# Patient Record
Sex: Female | Born: 1970
Health system: Southern US, Community
[De-identification: ages and names within clinical notes are randomized; demographics above are authoritative.]

## PROBLEM LIST (undated history)

## (undated) DIAGNOSIS — R112 Nausea with vomiting, unspecified: Secondary | ICD-10-CM

## (undated) DIAGNOSIS — T4145XA Adverse effect of unspecified anesthetic, initial encounter: Secondary | ICD-10-CM

## (undated) DIAGNOSIS — M51369 Other intervertebral disc degeneration, lumbar region without mention of lumbar back pain or lower extremity pain: Secondary | ICD-10-CM

## (undated) DIAGNOSIS — T8859XA Other complications of anesthesia, initial encounter: Secondary | ICD-10-CM

## (undated) DIAGNOSIS — Z9889 Other specified postprocedural states: Secondary | ICD-10-CM

## (undated) DIAGNOSIS — M5136 Other intervertebral disc degeneration, lumbar region: Secondary | ICD-10-CM

## (undated) DIAGNOSIS — Z789 Other specified health status: Secondary | ICD-10-CM

## (undated) DIAGNOSIS — E7800 Pure hypercholesterolemia, unspecified: Secondary | ICD-10-CM

## (undated) HISTORY — PX: BREAST CYST ASPIRATION: SHX578

## (undated) HISTORY — PX: WISDOM TOOTH EXTRACTION: SHX21

## (undated) HISTORY — DX: Pure hypercholesterolemia, unspecified: E78.00

---

## 2001-06-19 ENCOUNTER — Other Ambulatory Visit: Admission: RE | Admit: 2001-06-19 | Discharge: 2001-06-19 | Payer: Self-pay | Admitting: Obstetrics and Gynecology

## 2002-11-27 ENCOUNTER — Inpatient Hospital Stay (HOSPITAL_COMMUNITY): Admission: AD | Admit: 2002-11-27 | Discharge: 2002-11-30 | Payer: Self-pay | Admitting: Obstetrics and Gynecology

## 2002-12-26 ENCOUNTER — Other Ambulatory Visit: Admission: RE | Admit: 2002-12-26 | Discharge: 2002-12-26 | Payer: Self-pay | Admitting: Obstetrics and Gynecology

## 2003-09-05 HISTORY — PX: BREAST SURGERY: SHX581

## 2003-11-13 ENCOUNTER — Encounter: Admission: RE | Admit: 2003-11-13 | Discharge: 2003-11-13 | Payer: Self-pay | Admitting: Obstetrics & Gynecology

## 2003-12-23 ENCOUNTER — Other Ambulatory Visit: Admission: RE | Admit: 2003-12-23 | Discharge: 2003-12-23 | Payer: Self-pay | Admitting: Obstetrics and Gynecology

## 2003-12-24 ENCOUNTER — Other Ambulatory Visit: Admission: RE | Admit: 2003-12-24 | Discharge: 2003-12-24 | Payer: Self-pay | Admitting: Obstetrics and Gynecology

## 2004-07-16 ENCOUNTER — Inpatient Hospital Stay (HOSPITAL_COMMUNITY): Admission: AD | Admit: 2004-07-16 | Discharge: 2004-07-17 | Payer: Self-pay | Admitting: Obstetrics and Gynecology

## 2004-12-28 ENCOUNTER — Other Ambulatory Visit: Admission: RE | Admit: 2004-12-28 | Discharge: 2004-12-28 | Payer: Self-pay | Admitting: Obstetrics and Gynecology

## 2005-09-01 ENCOUNTER — Encounter: Admission: RE | Admit: 2005-09-01 | Discharge: 2005-09-01 | Payer: Self-pay | Admitting: General Surgery

## 2007-01-02 ENCOUNTER — Encounter: Admission: RE | Admit: 2007-01-02 | Discharge: 2007-01-02 | Payer: Self-pay | Admitting: Neurological Surgery

## 2007-01-14 ENCOUNTER — Encounter: Admission: RE | Admit: 2007-01-14 | Discharge: 2007-01-14 | Payer: Self-pay | Admitting: Obstetrics and Gynecology

## 2007-01-15 ENCOUNTER — Encounter: Admission: RE | Admit: 2007-01-15 | Discharge: 2007-01-15 | Payer: Self-pay | Admitting: Neurological Surgery

## 2007-01-22 ENCOUNTER — Encounter: Admission: RE | Admit: 2007-01-22 | Discharge: 2007-01-22 | Payer: Self-pay | Admitting: Obstetrics and Gynecology

## 2007-01-22 ENCOUNTER — Encounter (INDEPENDENT_AMBULATORY_CARE_PROVIDER_SITE_OTHER): Payer: Self-pay | Admitting: Diagnostic Radiology

## 2007-04-24 HISTORY — PX: BREAST BIOPSY: SHX20

## 2008-05-13 ENCOUNTER — Encounter: Admission: RE | Admit: 2008-05-13 | Discharge: 2008-05-13 | Payer: Self-pay | Admitting: Neurological Surgery

## 2008-06-03 ENCOUNTER — Ambulatory Visit (HOSPITAL_COMMUNITY): Admission: RE | Admit: 2008-06-03 | Discharge: 2008-06-04 | Payer: Self-pay | Admitting: Neurological Surgery

## 2008-06-03 HISTORY — PX: HEMILAMINOTOMY LUMBAR SPINE: SUR654

## 2008-06-23 ENCOUNTER — Encounter: Admission: RE | Admit: 2008-06-23 | Discharge: 2008-06-23 | Payer: Self-pay | Admitting: Anesthesiology

## 2008-06-24 ENCOUNTER — Encounter: Admission: RE | Admit: 2008-06-24 | Discharge: 2008-06-24 | Payer: Self-pay | Admitting: Neurological Surgery

## 2010-09-25 ENCOUNTER — Encounter: Payer: Self-pay | Admitting: Obstetrics and Gynecology

## 2011-01-17 NOTE — Op Note (Signed)
Stacey Jordan, Stacey Jordan               ACCOUNT NO.:  [id number]   MEDICAL RECORD NO.:  [id number]          PATIENT TYPE:  OIB   LOCATION:  3599                         FACILITY:  [facility]   PHYSICIAN:  Tia Alert, MD     DATE OF BIRTH:  [birth year]   DATE OF PROCEDURE:  06/03/2008  DATE OF DISCHARGE:                               OPERATIVE REPORT   PREOPERATIVE DIAGNOSIS:  Lumbar disk herniation, L5-S1 on the left with  left S1 radiculopathy.   POSTOPERATIVE DIAGNOSIS:  Lumbar disk herniation, L5-S1 on the left with  left S1 radiculopathy.   PROCEDURE:  Left L5-S1 hemilaminectomy, medial facetectomy, and  foraminotomy followed by microdiskectomy of L5-S1 on the left utilizing  microscopic dissection.   SURGEON:  Tia Alert, MD   ASSISTANT:  Donalee Citrin, MD   ANESTHESIA:  General endotracheal.   COMPLICATIONS:  None apparent.   INDICATIONS FOR PROCEDURE:  Ms. Stacey Jordan is a very pleasant 40 year old  female who presented with severe left leg pain, which seemed to follow  an S1 distribution.  She had an MRI, which showed a very large disk  herniation L5-S1 on the left with a large free fragment extending  caudally under the left S1 nerve root.  I recommended microdiskectomy at  L5-S1, left sided, and hope so of improving her pain syndrome.  She  understood the risks, the benefits, and the expected outcome and wished  to proceed.   DESCRIPTION OF PROCEDURE:  The patient was taken to the operating room  and after induction of adequate generalized endotracheal anesthesia, she  was rolled into the prone position on the Wilson frame.  All pressure  points were padded.  Her lumbar region was prepped with DuraPrep and  draped in the usual sterile fashion.  A 5 mL local anesthesia was  injected and a small dorsal midline incision was made and carried down  to the lumbosacral fascia.  The fascia was opened.  The paraspinous  musculature was taken down in a subperiosteal fashion to expose  L5-S1 on  the left side.  Intraoperative x-ray confirmed my level and then used  the high-speed drill and Kerrison punches to perform a hemilaminectomy,  medial facetectomy, and foraminotomy at L5-S1 on the left underlying  yellow ligament was opened and removed in a piecemeal fashion exposing  the underlying dura and S1 nerve root.  The S1 nerve root was retracted  medially and the epidural venous vasculature was coagulated and cut  sharply with micro scissors.  The operating microscope was brought to  the field.  A large disk herniation was found and incised and then  removed with a nerve hook and pituitary rongeurs.  We then incised the  disk space and performed a thorough intradiskal diskectomy, also  bringing down large fragments from the midline.  Once the diskectomy was  complete, I palpated with coronary dilator and a nerve hook into the  midline and into the foramen to assure adequate decompression of the  nerve root.  The nerve root was free and pulsatile irrigated with saline  solution containing bacitracin.  Dried  all bleeding points.  Inspected  the nerve root once again, then lined the dura with dura form to help  prevent epidural fibrosis and then closed with 0 Vicryl in the fascia, 2-  0 Vicryl in the subcutaneous tissues, and 3-0  Vicryl in the subcuticular tissues.  The skin was closed with Benzoin  and Steri-Strips.  The drapes were removed.  Sterile dressing was  applied.  The patient was awakened from general anesthesia and  transferred to the recovery room in stable condition.  At the end of the  procedure, all sponge, needle, and instrument counts were correct.      Tia Alert, MD  Electronically Signed     DSJ/MEDQ  D:  06/03/2008  T:  06/04/2008  Job:  [id number]

## 2011-06-05 LAB — BASIC METABOLIC PANEL
BUN: 11
CO2: 25
Calcium: 9.9
Chloride: 106
Creatinine, Ser: 0.8
GFR calc Af Amer: >60
GFR calc non Af Amer: >60
Glucose, Bld: 94
Potassium: 3.7
Sodium: 137

## 2011-06-05 LAB — DIFFERENTIAL
Basophils Absolute: 0.1
Basophils Relative: 1
Eosinophils Absolute: 0.1
Eosinophils Relative: 1
Lymphocytes Relative: 23
Lymphs Abs: 1.9
Monocytes Absolute: 0.5
Monocytes Relative: 7
Neutro Abs: 5.6
Neutrophils Relative %: 69

## 2011-06-05 LAB — PROTIME-INR
INR: 1.1
Prothrombin Time: 14.9

## 2011-06-05 LAB — CBC
HCT: 40.9
Hemoglobin: 14
MCHC: 34.2
MCV: 90
Platelets: 241
RBC: 4.54
RDW: 12.8
WBC: 8.1

## 2011-06-05 LAB — APTT: aPTT: 33

## 2011-11-13 ENCOUNTER — Other Ambulatory Visit: Payer: Self-pay | Admitting: Obstetrics and Gynecology

## 2011-11-16 ENCOUNTER — Ambulatory Visit
Admission: RE | Admit: 2011-11-16 | Discharge: 2011-11-16 | Disposition: A | Discharge status: Home / Self Care | Payer: 59 | Source: Ambulatory Visit | Attending: Obstetrics and Gynecology | Admitting: Obstetrics and Gynecology

## 2012-10-19 ENCOUNTER — Other Ambulatory Visit: Payer: Self-pay

## 2013-03-27 ENCOUNTER — Encounter (HOSPITAL_BASED_OUTPATIENT_CLINIC_OR_DEPARTMENT_OTHER): Payer: Self-pay | Admitting: *Deleted

## 2013-03-28 ENCOUNTER — Encounter (HOSPITAL_BASED_OUTPATIENT_CLINIC_OR_DEPARTMENT_OTHER): Payer: Self-pay | Admitting: *Deleted

## 2013-03-28 NOTE — Progress Notes (Signed)
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

## 2013-04-03 ENCOUNTER — Encounter (HOSPITAL_BASED_OUTPATIENT_CLINIC_OR_DEPARTMENT_OTHER): Payer: Self-pay | Admitting: Anesthesiology

## 2013-04-03 ENCOUNTER — Encounter (HOSPITAL_BASED_OUTPATIENT_CLINIC_OR_DEPARTMENT_OTHER)
Admission: RE | Disposition: A | Discharge status: Home / Self Care | Payer: Self-pay | Source: Ambulatory Visit | Attending: Orthopedic Surgery

## 2013-04-03 ENCOUNTER — Ambulatory Visit (HOSPITAL_BASED_OUTPATIENT_CLINIC_OR_DEPARTMENT_OTHER): Payer: 59 | Admitting: Anesthesiology

## 2013-04-03 ENCOUNTER — Ambulatory Visit (HOSPITAL_BASED_OUTPATIENT_CLINIC_OR_DEPARTMENT_OTHER)
Admission: RE | Admit: 2013-04-03 | Discharge: 2013-04-03 | Disposition: A | Discharge status: Home / Self Care | Payer: 59 | Source: Ambulatory Visit | Attending: Orthopedic Surgery | Admitting: Orthopedic Surgery

## 2013-04-03 DIAGNOSIS — M51379 Other intervertebral disc degeneration, lumbosacral region without mention of lumbar back pain or lower extremity pain: Secondary | ICD-10-CM | POA: Insufficient documentation

## 2013-04-03 DIAGNOSIS — M629 Disorder of muscle, unspecified: Secondary | ICD-10-CM | POA: Insufficient documentation

## 2013-04-03 DIAGNOSIS — M5137 Other intervertebral disc degeneration, lumbosacral region: Secondary | ICD-10-CM | POA: Insufficient documentation

## 2013-04-03 DIAGNOSIS — R2231 Localized swelling, mass and lump, right upper limb: Secondary | ICD-10-CM

## 2013-04-03 DIAGNOSIS — M2420 Disorder of ligament, unspecified site: Secondary | ICD-10-CM | POA: Insufficient documentation

## 2013-04-03 HISTORY — DX: Other specified postprocedural states: Z98.890

## 2013-04-03 HISTORY — DX: Other intervertebral disc degeneration, lumbar region without mention of lumbar back pain or lower extremity pain: M51.369

## 2013-04-03 HISTORY — PX: MASS EXCISION: SHX2000

## 2013-04-03 HISTORY — DX: Adverse effect of unspecified anesthetic, initial encounter: T41.45XA

## 2013-04-03 HISTORY — DX: Other specified health status: Z78.9

## 2013-04-03 HISTORY — DX: Other complications of anesthesia, initial encounter: T88.59XA

## 2013-04-03 HISTORY — DX: Other intervertebral disc degeneration, lumbar region: M51.36

## 2013-04-03 HISTORY — DX: Other specified postprocedural states: R11.2

## 2013-04-03 LAB — POCT HEMOGLOBIN-HEMACUE: Hemoglobin: 13.6 g/dL (ref 12.0–15.0)

## 2013-04-03 SURGERY — EXCISION MASS
Anesthesia: Monitor Anesthesia Care | Site: Finger | Laterality: Right | Wound class: Clean

## 2013-04-03 MED ORDER — BUPIVACAINE HCL (PF) 0.25 % IJ SOLN
INTRAMUSCULAR | Status: DC | PRN
  Administered 2013-04-03: 30 mL

## 2013-04-03 MED ORDER — HYDROCODONE-ACETAMINOPHEN 5-300 MG PO TABS: 1.0000 | ORAL_TABLET | Freq: Four times a day (QID) | ORAL | Status: DC | PRN

## 2013-04-03 MED ORDER — LIDOCAINE HCL 1 % IJ SOLN
INTRAMUSCULAR | Status: DC | PRN
  Administered 2013-04-03: 30 mL

## 2013-04-03 MED ORDER — ONDANSETRON HCL 4 MG/2ML IJ SOLN
INTRAMUSCULAR | Status: DC | PRN
  Administered 2013-04-03: 4 mg via INTRAVENOUS

## 2013-04-03 MED ORDER — LACTATED RINGERS IV SOLN
INTRAVENOUS | Status: DC
  Administered 2013-04-03: 12:00:00 via INTRAVENOUS

## 2013-04-03 MED ORDER — MIDAZOLAM HCL 5 MG/5ML IJ SOLN
INTRAMUSCULAR | Status: DC | PRN
  Administered 2013-04-03: 2 mg via INTRAVENOUS

## 2013-04-03 MED ORDER — PROPOFOL INFUSION 10 MG/ML OPTIME
INTRAVENOUS | Status: DC | PRN
  Administered 2013-04-03: 100 ug/kg/min via INTRAVENOUS

## 2013-04-03 MED ORDER — FENTANYL CITRATE 0.05 MG/ML IJ SOLN
50.0000 ug | INTRAMUSCULAR | Status: DC | PRN
Start: 2013-04-03 — End: 2013-04-03

## 2013-04-03 MED ORDER — MIDAZOLAM HCL 2 MG/2ML IJ SOLN: 1.0000 mg | INTRAMUSCULAR | Status: DC | PRN

## 2013-04-03 MED ORDER — CEFAZOLIN SODIUM-DEXTROSE 2-3 GM-% IV SOLR
INTRAVENOUS | Status: DC | PRN
  Administered 2013-04-03: 2 g via INTRAVENOUS

## 2013-04-03 MED ORDER — PROPOFOL 10 MG/ML IV BOLUS
INTRAVENOUS | Status: DC | PRN
  Administered 2013-04-03: 80 mg via INTRAVENOUS

## 2013-04-03 MED ORDER — DEXAMETHASONE SODIUM PHOSPHATE 10 MG/ML IJ SOLN
INTRAMUSCULAR | Status: DC | PRN
  Administered 2013-04-03: 10 mg via INTRAVENOUS

## 2013-04-03 MED ORDER — FENTANYL CITRATE 0.05 MG/ML IJ SOLN
INTRAMUSCULAR | Status: DC | PRN
  Administered 2013-04-03: 100 ug via INTRAVENOUS

## 2013-04-03 SURGICAL SUPPLY — 50 items
BANDAGE CONFORM 2  STR LF (GAUZE/BANDAGES/DRESSINGS) IMPLANT
BANDAGE ELASTIC 3 VELCRO ST LF (GAUZE/BANDAGES/DRESSINGS) IMPLANT
BANDAGE ELASTIC 4 VELCRO ST LF (GAUZE/BANDAGES/DRESSINGS) IMPLANT
BANDAGE GAUZE STRT 1 STR LF (GAUZE/BANDAGES/DRESSINGS) ×2 IMPLANT
BENZOIN TINCTURE PRP APPL 2/3 (GAUZE/BANDAGES/DRESSINGS) ×2 IMPLANT
BLADE SURG 15 STRL LF DISP TIS (BLADE) ×1 IMPLANT
BLADE SURG 15 STRL SS (BLADE) ×1
BNDG ESMARK 4X9 LF (GAUZE/BANDAGES/DRESSINGS) ×2 IMPLANT
CORDS BIPOLAR (ELECTRODE) ×2 IMPLANT
COVER MAYO STAND STRL (DRAPES) ×2 IMPLANT
COVER TABLE BACK 60X90 (DRAPES) ×2 IMPLANT
CUFF TOURNIQUET SINGLE 18IN (TOURNIQUET CUFF) IMPLANT
DECANTER SPIKE VIAL GLASS SM (MISCELLANEOUS) IMPLANT
DRAPE EXTREMITY T 121X128X90 (DRAPE) ×2 IMPLANT
DRAPE SURG 17X23 STRL (DRAPES) ×2 IMPLANT
DRSG EMULSION OIL 3X3 NADH (GAUZE/BANDAGES/DRESSINGS) ×2 IMPLANT
GLOVE BIO SURGEON STRL SZ 6.5 (GLOVE) ×2 IMPLANT
GLOVE BIOGEL PI IND STRL 7.0 (GLOVE) ×2 IMPLANT
GLOVE BIOGEL PI IND STRL 8.5 (GLOVE) ×1 IMPLANT
GLOVE BIOGEL PI INDICATOR 7.0 (GLOVE) ×2
GLOVE BIOGEL PI INDICATOR 8.5 (GLOVE) ×1
GLOVE EXAM NITRILE MD LF STRL (GLOVE) ×2 IMPLANT
GLOVE SURG ORTHO 8.0 STRL STRW (GLOVE) ×2 IMPLANT
GOWN PREVENTION PLUS XLARGE (GOWN DISPOSABLE) ×2 IMPLANT
GOWN PREVENTION PLUS XXLARGE (GOWN DISPOSABLE) IMPLANT
GOWN STRL REIN 2XL XLG LVL4 (GOWN DISPOSABLE) ×2 IMPLANT
NEEDLE HYPO 25X1 1.5 SAFETY (NEEDLE) ×2 IMPLANT
NS IRRIG 1000ML POUR BTL (IV SOLUTION) ×2 IMPLANT
PACK BASIN DAY SURGERY FS (CUSTOM PROCEDURE TRAY) ×2 IMPLANT
PAD CAST 3X4 CTTN HI CHSV (CAST SUPPLIES) ×1 IMPLANT
PADDING CAST ABS 3INX4YD NS (CAST SUPPLIES)
PADDING CAST ABS 4INX4YD NS (CAST SUPPLIES)
PADDING CAST ABS COTTON 3X4 (CAST SUPPLIES) IMPLANT
PADDING CAST ABS COTTON 4X4 ST (CAST SUPPLIES) IMPLANT
PADDING CAST COTTON 3X4 STRL (CAST SUPPLIES) ×1
SPLINT PLASTER CAST XFAST 3X15 (CAST SUPPLIES) IMPLANT
SPLINT PLASTER XTRA FASTSET 3X (CAST SUPPLIES)
SPONGE GAUZE 2X2 12PLY UNSTER (GAUZE/BANDAGES/DRESSINGS) ×2 IMPLANT
SPONGE GAUZE 4X4 12PLY (GAUZE/BANDAGES/DRESSINGS) ×2 IMPLANT
STOCKINETTE 4X48 STRL (DRAPES) ×2 IMPLANT
STRIP CLOSURE SKIN 1/2X4 (GAUZE/BANDAGES/DRESSINGS) IMPLANT
SUT MNCRL AB 3-0 PS2 18 (SUTURE) ×2 IMPLANT
SUT MNCRL AB 4-0 PS2 18 (SUTURE) IMPLANT
SUT PROLENE 4 0 PS 2 18 (SUTURE) IMPLANT
SUT PROLENE 5 0 P 3 (SUTURE) ×2 IMPLANT
SYR BULB 3OZ (MISCELLANEOUS) ×2 IMPLANT
SYR CONTROL 10ML LL (SYRINGE) ×2 IMPLANT
TOWEL OR 17X24 6PK STRL BLUE (TOWEL DISPOSABLE) ×2 IMPLANT
TRAY DSU PREP LF (CUSTOM PROCEDURE TRAY) ×2 IMPLANT
UNDERPAD 30X30 INCONTINENT (UNDERPADS AND DIAPERS) ×2 IMPLANT

## 2013-04-03 NOTE — H&P (Signed)
Stacey Jordan is an 42 y.o. female.   Chief Complaint: Right long finger mass  HPI: Pt with mass on right long finger. Pt's mass has been increasing in size. Pt here for surgery excisional biopsy.   Past Medical History  Diagnosis Date  . Complication of anesthesia     very sensitive to aneth and pain meds  . PONV (postoperative nausea and vomiting)   . Medical history non-contributory   . DDD (degenerative disc disease), lumbar     Past Surgical History  Procedure Laterality Date  . Hemilaminotomy lumbar spine Left 06/03/2008    L5-S1; with microdiscectomy and medial facetectomy  . Wisdom tooth extraction      History reviewed. No pertinent family history. Social History:  reports that she has never smoked. She does not have any smokeless tobacco history on file. She reports that  drinks alcohol. She reports that she does not use illicit drugs.  Allergies: No Known Allergies  No prescriptions prior to admission    No results found for this or any previous visit (from the past 48 hour(s)). No results found.  NO recent illnesses or hospitalizations  Blood pressure 113/76, pulse 85, temperature 98.9 F (37.2 C), temperature source Oral, resp. rate 20, height 5\' 4"  (1.626 m), weight 52.527 kg (115 lb 12.8 oz), SpO2 100.00%. General Appearance:  Alert, cooperative, no distress, appears stated age  Head:  Normocephalic, without obvious abnormality, atraumatic  Eyes:  Pupils equal, conjunctiva/corneas clear,         Throat: Lips, mucosa, and tongue normal; teeth and gums normal  Neck: No visible masses     Lungs:   respirations unlabored  Chest Wall:  No tenderness or deformity  Heart:  Regular rate and rhythm,  Abdomen:   Soft, non-tender,         Extremities: Right long finger mass over dorsum of middle phalanx region. No open wounds. Finger warm well perfused Good wrist and digital motion  Pulses: 2+ and symmetric  Skin: Skin color, texture, turgor normal, no  rashes or lesions     Neurologic: Normal    Assessment/Plan Right long finger mass  Right long finger mass excisional biopsy  R/B/A DISCUSSED WITH PT IN OFFICE.  PT VOICED UNDERSTANDING OF PLAN CONSENT SIGNED DAY OF SURGERY PT SEEN AND EXAMINED PRIOR TO OPERATIVE PROCEDURE/DAY OF SURGERY SITE MARKED. QUESTIONS ANSWERED WILL [facility] FOLLOWING SURGERY  Sharma Covert 04/03/2013, 12:25 PM

## 2013-04-03 NOTE — Discharge Instructions (Signed)
KEEP BANDAGE CLEAN AND DRY CALL OFFICE FOR F/U APPT [phone] in 10-12 days KEEP HAND ELEVATED ABOVE HEART OK TO APPLY ICE TO OPERATIVE AREA CONTACT OFFICE IF ANY WORSENING PAIN OR CONCERNS.   Post Anesthesia Home Care Instructions  Activity: Get plenty of rest for the remainder of the day. A responsible adult should stay with you for 24 hours following the procedure.  For the next 24 hours, DO NOT: -Drive a car -Advertising copywriter -Drink alcoholic beverages -Take any medication unless instructed by your physician -Make any legal decisions or sign important papers.  Meals: Start with liquid foods such as gelatin or soup. Progress to regular foods as tolerated. Avoid greasy, spicy, heavy foods. If nausea and/or vomiting occur, drink only clear liquids until the nausea and/or vomiting subsides. Call your physician if vomiting continues.  Special Instructions/Symptoms: Your throat may feel dry or sore from the anesthesia or the breathing tube placed in your throat during surgery. If this causes discomfort, gargle with warm salt water. The discomfort should disappear within 24 hours.

## 2013-04-03 NOTE — Anesthesia Preprocedure Evaluation (Addendum)
Anesthesia Evaluation  Patient identified by MRN, date of birth, ID band  Reviewed: Allergy & Precautions, H&P , NPO status , Patient's Chart, lab work & pertinent test results  History of Anesthesia Complications (+) PONV  Airway Mallampati: II TM Distance: >3 FB Neck ROM: Full    Dental no notable dental hx. (+) Teeth Intact and Dental Advisory Given   Pulmonary neg pulmonary ROS,  breath sounds clear to auscultation  Pulmonary exam normal       Cardiovascular negative cardio ROS  Rhythm:Regular Rate:Normal     Neuro/Psych negative neurological ROS  negative psych ROS   GI/Hepatic negative GI ROS, Neg liver ROS,   Endo/Other  negative endocrine ROS  Renal/GU negative Renal ROS  negative genitourinary   Musculoskeletal   Abdominal   Peds  Hematology negative hematology ROS (+)   Anesthesia Other Findings   Reproductive/Obstetrics negative OB ROS                          Anesthesia Physical Anesthesia Plan  ASA: I  Anesthesia Plan: MAC   Post-op Pain Management:    Induction: Intravenous  Airway Management Planned: Simple Face Mask  Additional Equipment:   Intra-op Plan:   Post-operative Plan:   Informed Consent: I have reviewed the patients History and Physical, chart, labs and discussed the procedure including the risks, benefits and alternatives for the proposed anesthesia with the patient or authorized representative who has indicated his/her understanding and acceptance.   Dental advisory given  Plan Discussed with: CRNA  Anesthesia Plan Comments:         Anesthesia Quick Evaluation

## 2013-04-03 NOTE — Transfer of Care (Signed)
Immediate Anesthesia Transfer of Care Note  Patient: Stacey Jordan  Procedure(s) Performed: Procedure(s):  MASS EXCISION AND BIOPSY (Right)  Patient Location: PACU  Anesthesia Type:MAC  Level of Consciousness: awake, alert  and oriented  Airway & Oxygen Therapy: Patient Spontanous Breathing  Post-op Assessment: Report given to PACU RN and Post -op Vital signs reviewed and stable  Post vital signs: Reviewed and stable  Complications: No apparent anesthesia complications

## 2013-04-03 NOTE — Brief Op Note (Signed)
04/03/2013  1:42 PM  PATIENT:  Stacey Jordan  42 y.o. female  PRE-OPERATIVE DIAGNOSIS:  RIGHT LONG FINGER MASS  POST-OPERATIVE DIAGNOSIS:  same  PROCEDURE:  Procedure(s):  MASS EXCISION AND BIOPSY (Right)  SURGEON:  Surgeon(s) and Role:    * Sharma Covert, MD - Primary  PHYSICIAN ASSISTANT: none   ASSISTANTS: none   ANESTHESIA:   general and MAC  EBL:  Total I/O In: 200 [I.V.:200] Out: -   BLOOD ADMINISTERED:none  DRAINS: none   LOCAL MEDICATIONS USED:  MARCAINE     SPECIMEN:  Biopsy / Limited Resection  DISPOSITION OF SPECIMEN:  PATHOLOGY  COUNTS:  YES  TOURNIQUET:   Total Tourniquet Time Documented: Upper Arm (Right) - 13 minutes Total: Upper Arm (Right) - 13 minutes   DICTATION: .Other Dictation: Dictation Number [id number]  PLAN OF CARE: Discharge to home after PACU  PATIENT DISPOSITION:  PACU - hemodynamically stable.   Delay start of Pharmacological VTE agent (>24hrs) due to surgical blood loss or risk of bleeding: not applicable

## 2013-04-04 NOTE — Anesthesia Postprocedure Evaluation (Signed)
  Anesthesia Post-op Note  Patient: Stacey Jordan  Procedure(s) Performed: Procedure(s):  MASS EXCISION AND BIOPSY (Right)  Patient Location: PACU  Anesthesia Type:MAC  Level of Consciousness: awake  Airway and Oxygen Therapy: Patient Spontanous Breathing  Post-op Pain: none  Post-op Assessment: Post-op Vital signs reviewed, Patient's Cardiovascular Status Stable and Respiratory Function Stable  Post-op Vital Signs: Reviewed and stable  Complications: No apparent anesthesia complications

## 2013-04-06 LAB — TISSUE CULTURE: Gram Stain: NONE SEEN

## 2013-04-07 NOTE — Op Note (Signed)
NAME:  Stacey Jordan, Stacey Jordan NO.:  [id number]  MEDICAL RECORD NO.:  [id number]  LOCATION:                               FACILITY:  [facility]  PHYSICIAN:  Madelynn Done, MD  DATE OF BIRTH:  Aug 07, 1971  DATE OF PROCEDURE:  04/03/2013 DATE OF DISCHARGE:  04/03/2013                              OPERATIVE REPORT   PREOPERATIVE DIAGNOSIS:  Right long finger volar mass less than 1.5 cm, deep mass.  POSTOPERATIVE DIAGNOSIS:  Right long finger volar mass less than 1.5 cm, deep mass.  ATTENDING PHYSICIAN:  Madelynn Done, MD, who scrubbed and present for the entire procedure.  ASSISTANT SURGEON:  None.  ANESTHESIA:  1% Xylocaine, 0.25% Marcaine, local block with IV sedation.  SURGICAL PROCEDURE:  Right long finger deep mass excision less than 1.5 cm.  SURGICAL INDICATIONS:  Mrs. Stacey Jordan is a right-hand-dominant female with enlarging mass of the right long finger.  The patient has elected to undergo excisional biopsy.  Risks, benefits and alternatives were discussed in detail with the patient.  Signed informed consent was obtained.  Risks include, but not limited to bleeding; infection; damage to nearby nerves, arteries, or tendons; loss of motion of wrist and digits; incomplete relief of symptoms; and need for further surgical intervention.  DESCRIPTION OF PROCEDURE:  The patient was properly identified in the preop holding area and marked with a permanent marker made on the right long finger to indicate the correct operative site.  The patient was then brought back to the operating room and placed supine on the anesthesia room table where the IV sedation was administered.  The patient tolerated this well.  A well-padded tourniquet was then placed in the right forearm and sealed with 1000 drape.  Local anesthetic was administered.  The patient tolerated this.  The right upper extremity was then prepped and draped in normal sterile fashion.  Time-out was called, the  correct side was identified and procedure again.  An oblique incision was made directly over the middle phalanx of the long finger after the tourniquet insufflated.  Dissection was then carried down through the skin and subcutaneous tissue deep to the subcutaneous tissue.  The mass was then encountered.  This was less than 1.5 cm mass, the adherence of the flexor sheath.  Neurovascular bundles were then retracted in their respective directions.  Circumferential dissection was then carried out.  I did have some mild adhesions to the subcutaneous tissue and skin.  It had appearance more of a granuloma. It did not appear to look like a giant cell tumor, tendon sheath or lipoma, did not have very distinct borders.  After removal of the visible/specimen, the wound was then thoroughly irrigated.  The skin was then closed using simple Prolene sutures.  Tourniquet was deflated. There was good perfusion of the fingertip.  Adaptic dressing and sterile compressive bandage were then applied.  The patient tolerated the procedure well and returned to the recovery room in good condition.  POSTPROCEDURAL PLAN:  The patient will be discharged home, seen back in the office in approximately 10 days for wound check, suture removal, and go over the Pathology.  Pathology, tissue specimens  for culture, aerobic, anaerobic, mycobacterium atypical and fungi as well as tissue for pathology looking for granulomatous disease.     Madelynn Done, MD     FWO/MEDQ  D:  04/03/2013  T:  04/03/2013  Job:  [id number]

## 2013-04-08 LAB — ANAEROBIC CULTURE: Gram Stain: NONE SEEN

## 2013-05-02 LAB — FUNGUS CULTURE W SMEAR

## 2013-05-16 LAB — AFB CULTURE WITH SMEAR (NOT AT ARMC)

## 2013-05-20 ENCOUNTER — Encounter: Payer: Self-pay | Admitting: Sports Medicine

## 2013-05-20 ENCOUNTER — Ambulatory Visit (INDEPENDENT_AMBULATORY_CARE_PROVIDER_SITE_OTHER): Payer: 59 | Admitting: Sports Medicine

## 2013-05-20 VITALS — BP 121/79 | HR 66 | Ht 64.0 in | Wt 118.0 lb

## 2013-05-20 DIAGNOSIS — G8929 Other chronic pain: Secondary | ICD-10-CM | POA: Insufficient documentation

## 2013-05-20 DIAGNOSIS — M7710 Lateral epicondylitis, unspecified elbow: Secondary | ICD-10-CM

## 2013-05-20 DIAGNOSIS — M7711 Lateral epicondylitis, right elbow: Secondary | ICD-10-CM

## 2013-05-20 DIAGNOSIS — M25529 Pain in unspecified elbow: Secondary | ICD-10-CM

## 2013-05-20 MED ORDER — NITROGLYCERIN 0.2 MG/HR TD PT24: MEDICATED_PATCH | TRANSDERMAL | Status: DC

## 2013-05-20 NOTE — Assessment & Plan Note (Signed)
She uses a low dose of Advil and I advised her off of that would be fine to continue for pain

## 2013-05-20 NOTE — Assessment & Plan Note (Signed)
Trial with nitroglycerin protocol  Exercise protocol to do daily  Use compression sleeve  She can try a cockup brace for typing if needed  Rescan 6 weeks

## 2013-05-20 NOTE — Patient Instructions (Addendum)
Nitroglycerin Protocol   Apply 1/4 nitroglycerin patch to affected area daily.  Change position of patch within the affected area every 24 hours.  You may experience a headache during the first 1-2 weeks of using the patch, these should subside.  If you experience headaches after beginning nitroglycerin patch treatment, you may take your preferred over the counter pain reliever.  Another side effect of the nitroglycerin patch is skin irritation or rash related to patch adhesive.  Please notify our office if you develop more severe headaches or rash, and stop the patch.  Tendon healing with nitroglycerin patch may require 12 to 24 weeks depending on the extent of injury.  Men should not use if taking Viagra, Cialis, or Levitra.   Do not use if you have migraines or rosacea.   Please do suggested exercises daily  Please follow up in 4-6 weeks   Thank you for seeing [place] today!

## 2013-05-20 NOTE — Progress Notes (Signed)
  Subjective:    Patient ID: Stacey Jordan, female    DOB: Apr 04, 1971, 42 y.o.   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 It bothers her the most when she tries to straighten her arm. Her pain is on the lateral side of her elbow. She has a hard time typing. No numbness or tingling.   She states that while her injury started without a specific event she does think it may relate to a day when she spent a long time working in her yard   Review of Systems     Objective:   Physical Exam Physically fit female in no acute distress  Right elbow: No swelling or deformity, full ROM with elbow flexion/extension, but pain with extension. Tenderness to palpation on lateral epicondyle. Pain with resisted wrist extension and supination. Positive book test.   Left elbow: No swelling or deformity, full ROM, minimal tenderness to palpation of lateral epicondyle, no pain with resisted wrist extension or supination. Positive book test.   Ultrasound: Right elbow shows a small area of hypoechoic change at the insertion of the extensor tendons More to the ulnar side No neo-vessel activity There is some fibrosis noted in the extensor tendons indicated by Hyper- echogenicity  Left elbow shows an intact area of the extensor tendons without any tears and normal blood flow Slight hyper-echogenicity      Assessment & Plan:  Right lateral epicondylitis   - Nitroglycerin patches to right elbow - Elbow compression sleeve - PT exercises

## 2013-07-02 ENCOUNTER — Ambulatory Visit (INDEPENDENT_AMBULATORY_CARE_PROVIDER_SITE_OTHER): Payer: 59 | Admitting: Sports Medicine

## 2013-07-02 ENCOUNTER — Encounter: Payer: Self-pay | Admitting: Sports Medicine

## 2013-07-02 VITALS — BP 124/82 | HR 60 | Ht 64.0 in | Wt 118.0 lb

## 2013-07-02 DIAGNOSIS — M25529 Pain in unspecified elbow: Secondary | ICD-10-CM

## 2013-07-02 DIAGNOSIS — G8929 Other chronic pain: Secondary | ICD-10-CM

## 2013-07-02 DIAGNOSIS — M7710 Lateral epicondylitis, unspecified elbow: Secondary | ICD-10-CM

## 2013-07-02 DIAGNOSIS — M7711 Lateral epicondylitis, right elbow: Secondary | ICD-10-CM

## 2013-07-02 NOTE — Assessment & Plan Note (Signed)
This is improving and I think she should continue the exercise program  Continue the nitroglycerin  Avoid lifting with full elbow extension  Recheck in 6-8 weeks

## 2013-07-02 NOTE — Progress Notes (Signed)
Patient ID: Stacey Jordan, female   DOB: 06/30/1971, 42 y.o.   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 She does notice the pain will increase if she does too much typing  She has used the nitroglycerin without much difficulty She has been somewhat inconsistent with her home exercises  She feels that she is seen 25-35% improvement No longer does any pain wake her up at night  She does feel this if she tries to care he something heavy or light grocery bags with her arm extended  PExam Physically fit female in no acute distress  She has hyperextension of both elbows and this is somewhat of an increase in range of motion of the right elbow She has mild direct tenderness over the right lateral epicondyle Note that a significant over the left Strength seems to be good on the right and left Test still does create some pain on the right  Ultrasound examination Extensor tendon is intact Hypoechoic change is much smaller There is some slight calcification in the area of injury Transverse scan confirms this Doppler flow has not increased in the area of injury in the tendon with neo-vessels  Left tendon appears normal to me

## 2013-07-02 NOTE — Assessment & Plan Note (Signed)
This is making reasonable improvement with no nocturnal pain  She can periodically try nitroglycerin on the left elbow if she has much pain to see if it makes a difference on that one as well

## 2013-07-10 ENCOUNTER — Other Ambulatory Visit: Payer: Self-pay

## 2013-07-21 ENCOUNTER — Encounter: Payer: Self-pay | Admitting: Sports Medicine

## 2013-07-21 ENCOUNTER — Ambulatory Visit (INDEPENDENT_AMBULATORY_CARE_PROVIDER_SITE_OTHER): Payer: 59 | Admitting: Sports Medicine

## 2013-07-21 VITALS — Ht 64.0 in | Wt 118.0 lb

## 2013-07-21 DIAGNOSIS — M25562 Pain in left knee: Secondary | ICD-10-CM

## 2013-07-21 DIAGNOSIS — M25569 Pain in unspecified knee: Secondary | ICD-10-CM

## 2013-07-22 NOTE — Progress Notes (Signed)
  Subjective:    Patient ID: Stacey Jordan, female    DOB: Mar 26, 1971, 43 y.o.   MRN: [id number]  HPI chief complaint: Left knee pain  Very pleasant 41 year old female comes in today complaining of several days of left knee pain. Pain began acutely after doing quite a bit of walking in [place]. She had pain as well as noticeable swelling over the anterior knee which seemed to Center itself over the tibial tuberosity. Over the past several days her pain and swelling have improved tremendously but she initially was having significant pain especially with active extension of the knee. She has been icing and feels like this has been helpful. She's had intermittent anterior knee pain in the past but her current pain and swelling are different in nature than what she experienced previously. No prior knee surgeries. No mechanical symptoms. No pain at rest. No fevers or chills. With relative rest and icing she feels like her symptoms have improved by about "80%" over the past week or so.  Past medical history and current medications are reviewed    Review of Systems     Objective:   Physical Exam Well-developed, fit-appearing. No acute distress. Awake alert and oriented x3. Vital signs are reviewed  Left knee: Full range of motion. 2-3+ patellofemoral crepitus. No effusion. No noticeable soft tissue swelling. Bilateral VMO weakness. There is tenderness to palpation directly over the tibial tubercle. No real tenderness along the patellar tendon. No tenderness or swelling directly over the patella. Knee is stable to valgus and varus stressing. Negative anterior drawer. Negative posterior drawer. No joint line tenderness. Negative McMurray's. Negative Thessaly's. Neurovascularly intact distally. Walking without a limp.  MSK ultrasound of the left knee was performed. Limited images of the anterior knee were obtained. Patellar tendon appears to be within normal limits. Insertion onto the tibial  tuberosity also appears to be within normal limits. There is a small hypoechoic area just anterior to the tibial tuberosity consistent with some minimal soft tissue swelling here but otherwise the scan is unremarkable.       Assessment & Plan:  Improving left knee pain likely secondary to insertional patellar tendinitis  I do not see any patellar tendon tear and on today's ultrasound. Her symptoms are already improving. I recommended she continue with ice and over-the-counter ibuprofen as needed. She can increase activity as tolerated. I've given her an eccentric home exercise for her patellar tendon and I've also instructed her in VMO exercises. As long as she is able to return to regular activity without complication she can followup when necessary.

## 2013-08-13 ENCOUNTER — Ambulatory Visit: Payer: 59 | Admitting: Sports Medicine

## 2014-02-09 ENCOUNTER — Ambulatory Visit (INDEPENDENT_AMBULATORY_CARE_PROVIDER_SITE_OTHER): Payer: 59 | Admitting: Podiatry

## 2014-02-09 ENCOUNTER — Encounter: Payer: Self-pay | Admitting: Podiatry

## 2014-02-09 DIAGNOSIS — B079 Viral wart, unspecified: Secondary | ICD-10-CM

## 2014-02-09 NOTE — Progress Notes (Signed)
Subjective:     Patient ID: Stacey Jordan, female   DOB: 1970/10/22, 43 y.o.   MRN: [id number]  Foot Pain   patient states that I have something on the bottom my left heel that I went to urgent care and that cut on it but they were not sure what it was  Review of Systems  All other systems reviewed and are negative.      Objective:   Physical Exam  Nursing note and vitals reviewed. Constitutional: She is oriented to person, place, and time.  Cardiovascular: Intact distal pulses.   Musculoskeletal: Normal range of motion.  Neurological: She is oriented to person, place, and time.  Skin: Skin is warm.   neurovascular status intact with health history unchanged and range of motion subtalar and midtarsal joint within normal limits. Muscle strength found to be adequate and no equinus condition noted and digits are found to be well perfused. Plantar aspect of left heel there is a small nodule that I also looked at the picture of which appears to be a lucent center with pain upon lateral pressure     Assessment:     Possible porokeratosis versus possible verruca plantaris    Plan:     H&P performed condition discussed. Today I went ahead debris the lesion fully and applied chemical to it under occlusion and advised if it should recur for her to reappoint immediately. Should resolve

## 2014-02-09 NOTE — Progress Notes (Signed)
   Subjective:    Patient ID: Stacey Jordan, female    DOB: 08-20-1971, 43 y.o.   MRN: [id number]  HPI Comments: "I have something on my foot"  Patient c/o tender, callused spot plantar left heel for few weeks. She went to urgent care and they trimmed on it. She has been using a drawing salve with no relief.  Foot Pain      Review of Systems  All other systems reviewed and are negative.      Objective:   Physical Exam        Assessment & Plan:

## 2015-11-04 ENCOUNTER — Telehealth: Payer: 59 | Admitting: Physician Assistant

## 2015-11-04 DIAGNOSIS — R6889 Other general symptoms and signs: Secondary | ICD-10-CM | POA: Diagnosis not present

## 2015-11-04 MED ORDER — OSELTAMIVIR PHOSPHATE 75 MG PO CAPS: 75.0000 mg | ORAL_CAPSULE | Freq: Two times a day (BID) | ORAL | Status: DC

## 2015-11-04 MED FILL — OSELTAMIVIR PHOS 75 MG CAP: 75 | 5 days supply | Qty: 10 | Fill #0

## 2015-11-04 NOTE — Progress Notes (Signed)
E visit for Flu like symptoms   We are sorry that you are not feeling well.  Here is how we plan to help! Based on what you have shared with me it looks like you may have a respiratory virus that may be influenza. Typically giving your young age and overall good health, we would not need to treat with Tamiflu for flu-like illness. 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 Please follow the instructions and read information below.  Influenza or "the flu" is   an infection caused by a respiratory virus. The flu virus is highly contagious and persons who did not receive their yearly flu vaccination may "catch" the flu from close contact.  We have anti-viral medications to treat the viruses that cause this infection. They are not a "cure" and only shorten the course of the infection. These prescriptions are most effective when they are given within the first 2 days of "flu" symptoms. Antiviral medication are indicated if you have a high risk of complications from the flu. You should  also consider an antiviral medication if you are in close contact with someone who is at risk. These medications can help patients avoid complications from the flu  but have side effects that you should know. Possible side effects from Tamiflu or oseltamivir include nausea, vomiting, diarrhea, dizziness, headaches, eye redness, sleep problems or other respiratory symptoms. You should not take Tamiflu if you have an allergy to oseltamivir or any to the ingredients in Tamiflu.  Based upon your symptoms and potential risk factors I have prescribed Oseltamivir (Tamiflu).  It has been sent to your designated pharmacy.  You will take one 75 mg capsule orally twice a day for the next 5 days.  ANYONE WHO HAS FLU SYMPTOMS SHOULD: . Stay home. The flu is highly contagious and going out or  to work exposes others! . Be sure to drink plenty of fluids. Water is fine as well as fruit juices, sodas and electrolyte beverages. You may want to stay away from caffeine or alcohol. If you are nauseated, try taking small sips of liquids. How do you know if you are getting enough fluid? Your urine should be a pale yellow or almost colorless. . Get rest. . Taking a steamy shower or using a humidifier may help nasal congestion and ease sore throat pain. Using a saline nasal spray works much the same way. . Cough drops, hard candies and sore throat lozenges may ease your cough. . Line up a caregiver. Have someone check on you regularly.   GET HELP RIGHT AWAY IF: . You cannot keep down liquids or your medications. . You become short of breath . Your fell like you are going to pass out or loose consciousness. . Your symptoms persist after you have completed your treatment plan MAKE SURE YOU   Understand these instructions.  Will watch your condition.  Will get help right away if you are not doing well or get worse.  Your e-visit answers were reviewed by a board certified advanced clinical practitioner to complete your personal care plan.  Depending on the condition, your plan could have included both over the counter or prescription medications.  If there is a problem please reply  once you have received a response from your provider.  Your safety is important to [place].  If you have drug allergies check your prescription carefully.    You can use MyChart to ask questions about today's visit, request a non-urgent call back, or ask for a work or school excuse for 24 hours related to this e-Visit. If it has been greater than 24 hours you will need to follow up with your provider, or enter a new e-Visit to address those concerns.  You will get an e-mail in the next two days asking about your experience.  I hope that your e-visit has been valuable and will speed your recovery. Thank you for using  e-visits.

## 2015-12-30 ENCOUNTER — Encounter: Payer: 59 | Admitting: Obstetrics and Gynecology

## 2016-01-10 ENCOUNTER — Encounter: Payer: 59 | Admitting: Obstetrics and Gynecology

## 2016-01-20 ENCOUNTER — Encounter: Payer: Self-pay | Admitting: Obstetrics and Gynecology

## 2016-01-20 ENCOUNTER — Ambulatory Visit (INDEPENDENT_AMBULATORY_CARE_PROVIDER_SITE_OTHER): Payer: 59 | Admitting: Obstetrics and Gynecology

## 2016-01-20 VITALS — BP 100/62 | HR 74 | Resp 16 | Ht 62.75 in | Wt 116.0 lb

## 2016-01-20 DIAGNOSIS — Z1151 Encounter for screening for human papillomavirus (HPV): Secondary | ICD-10-CM | POA: Diagnosis not present

## 2016-01-20 DIAGNOSIS — N63 Unspecified lump in breast: Secondary | ICD-10-CM | POA: Diagnosis not present

## 2016-01-20 DIAGNOSIS — N393 Stress incontinence (female) (male): Secondary | ICD-10-CM

## 2016-01-20 DIAGNOSIS — Z23 Encounter for immunization: Secondary | ICD-10-CM | POA: Diagnosis not present

## 2016-01-20 DIAGNOSIS — N6320 Unspecified lump in the left breast, unspecified quadrant: Secondary | ICD-10-CM

## 2016-01-20 DIAGNOSIS — Z01419 Encounter for gynecological examination (general) (routine) without abnormal findings: Secondary | ICD-10-CM | POA: Diagnosis not present

## 2016-01-20 DIAGNOSIS — Z0000 Encounter for general adult medical examination without abnormal findings: Secondary | ICD-10-CM

## 2016-01-20 LAB — COMPREHENSIVE METABOLIC PANEL
ALT: 8 U/L (ref 6–29)
AST: 15 U/L (ref 10–30)
Albumin: 4.3 g/dL (ref 3.6–5.1)
Alkaline Phosphatase: 38 U/L (ref 33–115)
BUN: 13 mg/dL (ref 7–25)
CALCIUM: 9 mg/dL (ref 8.6–10.2)
CO2: 27 mmol/L (ref 20–31)
Chloride: 104 mmol/L (ref 98–110)
Creat: 0.92 mg/dL (ref 0.50–1.10)
GLUCOSE: 94 mg/dL (ref 65–99)
POTASSIUM: 3.8 mmol/L (ref 3.5–5.3)
Sodium: 137 mmol/L (ref 135–146)
Total Bilirubin: 0.6 mg/dL (ref 0.2–1.2)
Total Protein: 6.9 g/dL (ref 6.1–8.1)

## 2016-01-20 LAB — CBC
HCT: 38.9 % (ref 35.0–45.0)
HEMOGLOBIN: 12.9 g/dL (ref 11.7–15.5)
MCH: 29.8 pg (ref 27.0–33.0)
MCHC: 33.2 g/dL (ref 32.0–36.0)
MCV: 89.8 fL (ref 80.0–100.0)
MPV: 10.2 fL (ref 7.5–12.5)
Platelets: 239 10*3/uL (ref 140–400)
RBC: 4.33 MIL/uL (ref 3.80–5.10)
RDW: 13.4 % (ref 11.0–15.0)
WBC: 7 10*3/uL (ref 3.8–10.8)

## 2016-01-20 LAB — POCT URINALYSIS DIPSTICK
BILIRUBIN UA: NEGATIVE
GLUCOSE UA: NEGATIVE
KETONES UA: NEGATIVE
LEUKOCYTES UA: NEGATIVE
Nitrite, UA: NEGATIVE
PH UA: 5
Protein, UA: NEGATIVE
RBC UA: NEGATIVE
Urobilinogen, UA: NEGATIVE

## 2016-01-20 LAB — BASIC METABOLIC PANEL
BUN: 13 mg/dL (ref 7–25)
CHLORIDE: 104 mmol/L (ref 98–110)
CO2: 27 mmol/L (ref 20–31)
CREATININE: 0.92 mg/dL (ref 0.50–1.10)
Calcium: 9 mg/dL (ref 8.6–10.2)
Glucose, Bld: 94 mg/dL (ref 65–99)
Potassium: 3.8 mmol/L (ref 3.5–5.3)
SODIUM: 137 mmol/L (ref 135–146)

## 2016-01-20 LAB — LIPID PANEL
Cholesterol: 188 mg/dL (ref 125–200)
HDL: 69 mg/dL (ref >=46)
LDL CALC: 94 mg/dL (ref <130)
Total CHOL/HDL Ratio: 2.7 Ratio (ref <=5.0)
Triglycerides: 126 mg/dL (ref <150)
VLDL: 25 mg/dL (ref <30)

## 2016-01-20 NOTE — Patient Instructions (Signed)
Health Maintenance, Female Adopting a healthy lifestyle and getting preventive care can go a long way to promote health and wellness. Talk with your health care provider about what schedule of regular examinations is right for you. This is a good chance for you to check in with your provider about disease prevention and staying healthy. In between checkups, there are plenty of things you can do on your own. Experts have done a lot of research about which lifestyle changes and preventive measures are most likely to keep you healthy. Ask your health care provider for more information. WEIGHT AND DIET  Eat a healthy diet  Be sure to include plenty of vegetables, fruits, low-fat dairy products, and lean protein.  Do not eat a lot of foods high in solid fats, added sugars, or salt.  Get regular exercise. This is one of the most important things you can do for your health.  Most adults should exercise for at least 150 minutes each week. The exercise should increase your heart rate and make you sweat (moderate-intensity exercise).  Most adults should also do strengthening exercises at least twice a week. This is in addition to the moderate-intensity exercise.  Maintain a healthy weight  Body mass index (BMI) is a measurement that can be used to identify possible weight problems. It estimates body fat based on height and weight. Your health care provider can help determine your BMI and help you achieve or maintain a healthy weight.  For females 20 years of age and older:   A BMI below 18.5 is considered underweight.  A BMI of 18.5 to 24.9 is normal.  A BMI of 25 to 29.9 is considered overweight.  A BMI of 30 and above is considered obese.  Watch levels of cholesterol and blood lipids  You should start having your blood tested for lipids and cholesterol at 44 years of age, then have this test every 5 years.  You may need to have your cholesterol levels checked more often if:  Your lipid  or cholesterol levels are high.  You are older than 44 years of age.  You are at high risk for heart disease.  CANCER SCREENING   Lung Cancer  Lung cancer screening is recommended for adults 55-80 years old who are at high risk for lung cancer because of a history of smoking.  A yearly low-dose CT scan of the lungs is recommended for people who:  Currently smoke.  Have quit within the past 15 years.  Have at least a 30-pack-year history of smoking. A pack year is smoking an average of one pack of cigarettes a day for 1 year.  Yearly screening should continue until it has been 15 years since you quit.  Yearly screening should stop if you develop a health problem that would prevent you from having lung cancer treatment.  Breast Cancer  Practice breast self-awareness. This means understanding how your breasts normally appear and feel.  It also means doing regular breast self-exams. Let your health care provider know about any changes, no matter how small.  If you are in your 20s or 30s, you should have a clinical breast exam (CBE) by a health care provider every 1-3 years as part of a regular health exam.  If you are 40 or older, have a CBE every year. Also consider having a breast X-ray (mammogram) every year.  If you have a family history of breast cancer, talk to your health care provider about genetic screening.  If you   are at high risk for breast cancer, talk to your health care provider about having an MRI and a mammogram every year.  Breast cancer gene (BRCA) assessment is recommended for women who have family members with BRCA-related cancers. BRCA-related cancers include:  Breast.  Ovarian.  Tubal.  Peritoneal cancers.  Results of the assessment will determine the need for genetic counseling and BRCA1 and BRCA2 testing. Cervical Cancer Your health care provider may recommend that you be screened regularly for cancer of the pelvic organs (ovaries, uterus, and  vagina). This screening involves a pelvic examination, including checking for microscopic changes to the surface of your cervix (Pap test). You may be encouraged to have this screening done every 3 years, beginning at age 21.  For women ages 30-65, health care providers may recommend pelvic exams and Pap testing every 3 years, or they may recommend the Pap and pelvic exam, combined with testing for human papilloma virus (HPV), every 5 years. Some types of HPV increase your risk of cervical cancer. Testing for HPV may also be done on women of any age with unclear Pap test results.  Other health care providers may not recommend any screening for nonpregnant women who are considered low risk for pelvic cancer and who do not have symptoms. Ask your health care provider if a screening pelvic exam is right for you.  If you have had past treatment for cervical cancer or a condition that could lead to cancer, you need Pap tests and screening for cancer for at least 20 years after your treatment. If Pap tests have been discontinued, your risk factors (such as having a new sexual partner) need to be reassessed to determine if screening should resume. Some women have medical problems that increase the chance of getting cervical cancer. In these cases, your health care provider may recommend more frequent screening and Pap tests. Colorectal Cancer  This type of cancer can be detected and often prevented.  Routine colorectal cancer screening usually begins at 44 years of age and continues through 44 years of age.  Your health care provider may recommend screening at an earlier age if you have risk factors for colon cancer.  Your health care provider may also recommend using home test kits to check for hidden blood in the stool.  A small camera at the end of a tube can be used to examine your colon directly (sigmoidoscopy or colonoscopy). This is done to check for the earliest forms of colorectal  cancer.  Routine screening usually begins at age 50.  Direct examination of the colon should be repeated every 5-10 years through 44 years of age. However, you may need to be screened more often if early forms of precancerous polyps or small growths are found. Skin Cancer  Check your skin from head to toe regularly.  Tell your health care provider about any new moles or changes in moles, especially if there is a change in a mole's shape or color.  Also tell your health care provider if you have a mole that is larger than the size of a pencil eraser.  Always use sunscreen. Apply sunscreen liberally and repeatedly throughout the day.  Protect yourself by wearing long sleeves, pants, a wide-brimmed hat, and sunglasses whenever you are outside. HEART DISEASE, DIABETES, AND HIGH BLOOD PRESSURE   High blood pressure causes heart disease and increases the risk of stroke. High blood pressure is more likely to develop in:  People who have blood pressure in the high end   of the normal range (130-139/85-89 mm Hg).  People who are overweight or obese.  People who are African American.  If you are 38-23 years of age, have your blood pressure checked every 3-5 years. If you are 61 years of age or older, have your blood pressure checked every year. You should have your blood pressure measured twice--once when you are at a hospital or clinic, and once when you are not at a hospital or clinic. Record the average of the two measurements. To check your blood pressure when you are not at a hospital or clinic, you can use:  An automated blood pressure machine at a pharmacy.  A home blood pressure monitor.  If you are between 45 years and 39 years old, ask your health care provider if you should take aspirin to prevent strokes.  Have regular diabetes screenings. This involves taking a blood sample to check your fasting blood sugar level.  If you are at a normal weight and have a low risk for diabetes,  have this test once every three years after 44 years of age.  If you are overweight and have a high risk for diabetes, consider being tested at a younger age or more often. PREVENTING INFECTION  Hepatitis B  If you have a higher risk for hepatitis B, you should be screened for this virus. You are considered at high risk for hepatitis B if:  You were born in a country where hepatitis B is common. Ask your health care provider which countries are considered high risk.  Your parents were born in a high-risk country, and you have not been immunized against hepatitis B (hepatitis B vaccine).  You have HIV or AIDS.  You use needles to inject street drugs.  You live with someone who has hepatitis B.  You have had sex with someone who has hepatitis B.  You get hemodialysis treatment.  You take certain medicines for conditions, including cancer, organ transplantation, and autoimmune conditions. Hepatitis C  Blood testing is recommended for:  Everyone born from [birth year] through 1964.  Anyone with known risk factors for hepatitis C. Sexually transmitted infections (STIs)  You should be screened for sexually transmitted infections (STIs) including gonorrhea and chlamydia if:  You are sexually active and are younger than 44 years of age.  You are older than 44 years of age and your health care provider tells you that you are at risk for this type of infection.  Your sexual activity has changed since you were last screened and you are at an increased risk for chlamydia or gonorrhea. Ask your health care provider if you are at risk.  If you do not have HIV, but are at risk, it may be recommended that you take a prescription medicine daily to prevent HIV infection. This is called pre-exposure prophylaxis (PrEP). You are considered at risk if:  You are sexually active and do not regularly use condoms or know the HIV status of your partner(s).  You take drugs by injection.  You are sexually  active with a partner who has HIV. Talk with your health care provider about whether you are at high risk of being infected with HIV. If you choose to begin PrEP, you should first be tested for HIV. You should then be tested every 3 months for as long as you are taking PrEP.  PREGNANCY   If you are premenopausal and you may become pregnant, ask your health care provider about preconception counseling.  If you may  become pregnant, take 400 to 800 micrograms (mcg) of folic acid every day.  If you want to prevent pregnancy, talk to your health care provider about birth control (contraception). OSTEOPOROSIS AND MENOPAUSE   Osteoporosis is a disease in which the bones lose minerals and strength with aging. This can result in serious bone fractures. Your risk for osteoporosis can be identified using a bone density scan.  If you are 61 years of age or older, or if you are at risk for osteoporosis and fractures, ask your health care provider if you should be screened.  Ask your health care provider whether you should take a calcium or vitamin D supplement to lower your risk for osteoporosis.  Menopause may have certain physical symptoms and risks.  Hormone replacement therapy may reduce some of these symptoms and risks. Talk to your health care provider about whether hormone replacement therapy is right for you.  HOME CARE INSTRUCTIONS   Schedule regular health, dental, and eye exams.  Stay current with your immunizations.   Do not use any tobacco products including cigarettes, chewing tobacco, or electronic cigarettes.  If you are pregnant, do not drink alcohol.  If you are breastfeeding, limit how much and how often you drink alcohol.  Limit alcohol intake to no more than 1 drink per day for nonpregnant women. One drink equals 12 ounces of beer, 5 ounces of wine, or 1 ounces of hard liquor.  Do not use street drugs.  Do not share needles.  Ask your health care provider for help if  you need support or information about quitting drugs.  Tell your health care provider if you often feel depressed.  Tell your health care provider if you have ever been abused or do not feel safe at home.   This information is not intended to replace advice given to you by your health care provider. Make sure you discuss any questions you have with your health care provider.   Document Released: 03/06/2011 Document Revised: 09/11/2014 Document Reviewed: 07/23/2013 Elsevier Interactive Patient Education [employer].

## 2016-01-20 NOTE — Progress Notes (Signed)
45 y.o. No obstetric history on file. Married Caucasian female here for annual exam.   New goal of taking better care of her health.   Uncertain about her IUD insertion date.  (This is her second one.) Current IUD may be expired.  No bleeding or spotting. Some discomfort with intercourse. Being followed for a left breast lump.   Has been since 2013 since mammogram and U/S were done.   Some leak of urine with cough and sneeze.   Some persistent palpitations of heart while she is at rest.   Son is 46 yo.  Daughter is 52 yo. 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   PCP:  Kelton Pillar, MD   No LMP recorded. Patient is not currently having periods (Reason: IUD).     Period Cycle (Days):  (no cycles due to Mirena IUD)     Sexually active: Yes.    The current method of family planning is IUD.    Exercising: No.  exercise Smoker:  no  Health Maintenance: Pap:  2014 normal History of abnormal Pap:  no MMG:  2-3 years ago normal with [facility]--read by [facility] Colonoscopy:  n/a BMD:   n/a  Result  n/a TDaP:  06/2006 Gardasil:   N/A HIV:  Negative in pregnancy.    Screening Labs:  Hb today: 12.8, Urine today: Neg   reports that she has never smoked. She has never used smokeless tobacco. She reports that she drinks about 3.0 oz of alcohol per week. She reports that she does not use illicit drugs.  Past Medical History  Diagnosis Date  . Complication of anesthesia     very sensitive to aneth and pain meds  . PONV (postoperative nausea and vomiting)   . Medical history non-contributory   . DDD (degenerative disc disease), lumbar   . Urinary incontinence     stress inducec    Past Surgical History  Procedure Laterality Date  . Hemilaminotomy lumbar spine Left 06/03/2008    L5-S1; with microdiscectomy and medial facetectomy  . Wisdom tooth extraction    . Mass excision Right 04/03/2013    Procedure:  MASS EXCISION AND BIOPSY;  Surgeon: Linna Hoff, MD;   Location: [facility];  Service: Orthopedics;  Laterality: Right;  . Breast surgery  2005    Benign Rt.breast Bx    Current Outpatient Prescriptions  Medication Sig Dispense Refill  . Cholecalciferol (VITAMIN D-3) 1000 units CAPS Take 1 capsule by mouth daily.    . vitamin B-12 (CYANOCOBALAMIN) 1000 MCG tablet Take 1,000 mcg by mouth daily.     No current facility-administered medications for this visit.    Family History  Problem Relation Age of Onset  . CAD Father   . Hypertension Father   . Hyperlipidemia Father   . Stroke Paternal Grandmother     ROS:  Pertinent items are noted in HPI.  Otherwise, a comprehensive ROS was negative.  Exam:   BP 100/62 mmHg  Pulse 74  Resp 16  Ht 5' 2.75" (1.594 m)  Wt 116 lb (52.617 kg)  BMI 20.71 kg/m2    General appearance: alert, cooperative and appears stated age Head: Normocephalic, without obvious abnormality, atraumatic Neck: no adenopathy, supple, symmetrical, trachea midline and thyroid normal to inspection and palpation Lungs: clear to auscultation bilaterally Breasts: normal appearance, no masses or tenderness, Inspection negative, No nipple retraction or dimpling, No nipple discharge or bleeding, No axillary or supraclavicular adenopathy on right breast.  Left breast  3 cm mass? at 3:00 and 7 mm mass at 2:00. No palpable nodes, retractions, or nipple discharge. Heart: regular rate and rhythm Abdomen: incisions:  No. , soft, non-tender; no masses, no organomegaly Extremities: extremities normal, atraumatic, no cyanosis or edema Skin: Skin color, texture, turgor normal. No rashes or lesions Lymph nodes: Cervical, supraclavicular, and axillary nodes normal. No abnormal inguinal nodes palpated Neurologic: Grossly normal  Pelvic: External genitalia:  no lesions              Urethra:  normal appearing urethra with no masses, tenderness or lesions              Bartholins and Skenes: normal                 Vagina:  normal appearing vagina with normal color and discharge, no lesions              Cervix: no lesions and IUD strings seen.              Pap taken: Yes.   Bimanual Exam:  Uterus:  normal size, contour, position, consistency, mobility, non-tender              Adnexa: normal adnexa and no mass, fullness, tenderness              Rectal exam: Yes.  .  Confirms.              Anus:  normal sphincter tone, no lesions  Chaperone was present for exam.  Assessment:   Well woman visit. Left breast masses. Possibly two.   Mirena IUD possibly expired.  Palpitations.  GSI.   Plan: Yearly mammogram recommended after age 89. Will schedule dx bilateral mammogram and left breast U/S. Recommended self breast exam.  Pap and HR HPV as above. Discussed Calcium, Vitamin D, regular exercise program including cardiovascular and weight bearing exercise. Labs performed.  Yes.  .   See orders. Routine labs.  Prescription medication(s) given.  No..    TDap.  Will get GYN records and see if Mirena is expired.  If so, patient would like a new Mirena IUD.  Use back up pregnancy protection.  Referral to PT for her bladder.  Patient will make an appointment to see her PCP about her palpitations.   Follow up annually and prn.        After visit summary provided.

## 2016-01-20 NOTE — Progress Notes (Signed)
Scheduled patient while in office for bilateral diagnostic mammogram [facility] for 01/25/2016 at 8:10 am. She is agreeable to date and time. Placed in mammogram hold.

## 2016-01-23 ENCOUNTER — Encounter: Payer: Self-pay | Admitting: Obstetrics and Gynecology

## 2016-01-25 ENCOUNTER — Ambulatory Visit
Admission: RE | Admit: 2016-01-25 | Discharge: 2016-01-25 | Disposition: A | Discharge status: Home / Self Care | Payer: 59 | Source: Ambulatory Visit | Attending: Obstetrics and Gynecology | Admitting: Obstetrics and Gynecology

## 2016-01-25 ENCOUNTER — Other Ambulatory Visit: Payer: Self-pay | Admitting: Obstetrics and Gynecology

## 2016-01-25 DIAGNOSIS — N63 Unspecified lump in breast: Secondary | ICD-10-CM | POA: Diagnosis not present

## 2016-01-25 DIAGNOSIS — R922 Inconclusive mammogram: Secondary | ICD-10-CM | POA: Diagnosis not present

## 2016-01-25 DIAGNOSIS — N6320 Unspecified lump in the left breast, unspecified quadrant: Secondary | ICD-10-CM

## 2016-01-26 LAB — IPS PAP TEST WITH HPV

## 2016-01-27 ENCOUNTER — Telehealth: Payer: Self-pay | Admitting: Obstetrics and Gynecology

## 2016-01-27 DIAGNOSIS — Z3009 Encounter for other general counseling and advice on contraception: Secondary | ICD-10-CM

## 2016-01-27 LAB — HEMOGLOBIN, FINGERSTICK: HEMOGLOBIN, FINGERSTICK: 12.8 g/dL (ref 12.0–16.0)

## 2016-01-27 NOTE — Telephone Encounter (Signed)
Please contact patient that I have received her records from [facility].   Her Mirena IUD was placed on 05/01/07.   It is expired.   She is not protected against pregnancy.  She needs to use condoms for pregnancy prevention.   The IUD will need removal.   I can place an order.  I would wait to place a new Mirena IUD, if desired, after she completes her breast care - biopsy/excision.   I am following along with her care.

## 2016-01-28 NOTE — Telephone Encounter (Signed)
Return call from patient. Advised last IUD placed 05-08-12 at [facility] so she does not need to have it changed until 05-08-2017. Patient is scheduled for annual exam in May 2018 with Dr Quincy Simmonds and will discuss IUD change at that time.  Discussed that if changing from one effective Mirena to another, will just be able select a date and remove and reinsert same day. Patient scheduled for annual 01-26-17.  Encounter closed.

## 2016-01-28 NOTE — Telephone Encounter (Signed)
Call to Patient, left message to call back. Ask for triage nurse.

## 2016-01-28 NOTE — Telephone Encounter (Signed)
Update to Mirena IUD status.   I have good news for the patient!  I found a more recent Mirena IUD insertion date of 05/08/12 at [facility].   Her IUD is effective until 05/08/2017.  This does not need to be changed yet.  Thanks.

## 2016-02-02 ENCOUNTER — Other Ambulatory Visit: Payer: Self-pay | Admitting: Obstetrics and Gynecology

## 2016-02-02 DIAGNOSIS — N6320 Unspecified lump in the left breast, unspecified quadrant: Secondary | ICD-10-CM

## 2016-02-03 ENCOUNTER — Ambulatory Visit
Admission: RE | Admit: 2016-02-03 | Discharge: 2016-02-03 | Disposition: A | Discharge status: Home / Self Care | Payer: 59 | Source: Ambulatory Visit | Attending: Obstetrics and Gynecology | Admitting: Obstetrics and Gynecology

## 2016-02-03 DIAGNOSIS — N6320 Unspecified lump in the left breast, unspecified quadrant: Secondary | ICD-10-CM

## 2016-02-03 DIAGNOSIS — N6012 Diffuse cystic mastopathy of left breast: Secondary | ICD-10-CM | POA: Diagnosis not present

## 2016-02-03 DIAGNOSIS — N63 Unspecified lump in breast: Secondary | ICD-10-CM | POA: Diagnosis not present

## 2016-02-03 HISTORY — PX: BREAST BIOPSY: SHX20

## 2016-02-14 ENCOUNTER — Other Ambulatory Visit: Payer: Self-pay | Admitting: General Surgery

## 2016-02-14 DIAGNOSIS — N6310 Unspecified lump in the right breast, unspecified quadrant: Secondary | ICD-10-CM

## 2016-02-14 DIAGNOSIS — N63 Unspecified lump in breast: Secondary | ICD-10-CM | POA: Diagnosis not present

## 2016-02-17 ENCOUNTER — Encounter (HOSPITAL_BASED_OUTPATIENT_CLINIC_OR_DEPARTMENT_OTHER): Payer: Self-pay | Admitting: *Deleted

## 2016-02-22 ENCOUNTER — Encounter (HOSPITAL_BASED_OUTPATIENT_CLINIC_OR_DEPARTMENT_OTHER)
Admission: RE | Admit: 2016-02-22 | Discharge: 2016-02-22 | Disposition: A | Discharge status: Home / Self Care | Payer: 59 | Source: Ambulatory Visit | Attending: General Surgery | Admitting: General Surgery

## 2016-02-22 DIAGNOSIS — N6092 Unspecified benign mammary dysplasia of left breast: Secondary | ICD-10-CM | POA: Diagnosis not present

## 2016-02-22 DIAGNOSIS — N6489 Other specified disorders of breast: Secondary | ICD-10-CM | POA: Diagnosis not present

## 2016-02-22 DIAGNOSIS — N63 Unspecified lump in breast: Secondary | ICD-10-CM | POA: Diagnosis present

## 2016-02-22 LAB — BASIC METABOLIC PANEL
ANION GAP: 5 (ref 5–15)
BUN: 9 mg/dL (ref 6–20)
CALCIUM: 9 mg/dL (ref 8.9–10.3)
CO2: 25 mmol/L (ref 22–32)
Chloride: 105 mmol/L (ref 101–111)
Creatinine, Ser: 0.84 mg/dL (ref 0.44–1.00)
Glucose, Bld: 87 mg/dL (ref 65–99)
POTASSIUM: 4.6 mmol/L (ref 3.5–5.1)
SODIUM: 135 mmol/L (ref 135–145)

## 2016-02-22 LAB — CBC WITH DIFFERENTIAL/PLATELET
BASOS ABS: 0.1 10*3/uL (ref 0.0–0.1)
BASOS PCT: 1 %
Eosinophils Absolute: 0.1 10*3/uL (ref 0.0–0.7)
Eosinophils Relative: 2 %
HEMATOCRIT: 36.9 % (ref 36.0–46.0)
Hemoglobin: 12.2 g/dL (ref 12.0–15.0)
LYMPHS PCT: 27 %
Lymphs Abs: 1.4 10*3/uL (ref 0.7–4.0)
MCH: 29.5 pg (ref 26.0–34.0)
MCHC: 33.1 g/dL (ref 30.0–36.0)
MCV: 89.3 fL (ref 78.0–100.0)
Monocytes Absolute: 0.4 10*3/uL (ref 0.1–1.0)
Monocytes Relative: 8 %
NEUTROS ABS: 3.2 10*3/uL (ref 1.7–7.7)
NEUTROS PCT: 62 %
Platelets: 195 10*3/uL (ref 150–400)
RBC: 4.13 MIL/uL (ref 3.87–5.11)
RDW: 13.1 % (ref 11.5–15.5)
WBC: 5.1 10*3/uL (ref 4.0–10.5)

## 2016-02-22 NOTE — Pre-Procedure Instructions (Signed)
Boost breeze given to pt with instruction to drink by 0415.

## 2016-02-23 ENCOUNTER — Inpatient Hospital Stay: Admission: RE | Admit: 2016-02-23 | Payer: 59 | Source: Ambulatory Visit

## 2016-02-23 ENCOUNTER — Other Ambulatory Visit: Payer: Self-pay | Admitting: General Surgery

## 2016-02-23 ENCOUNTER — Ambulatory Visit
Admission: RE | Admit: 2016-02-23 | Discharge: 2016-02-23 | Disposition: A | Discharge status: Home / Self Care | Payer: 59 | Source: Ambulatory Visit | Attending: General Surgery | Admitting: General Surgery

## 2016-02-23 DIAGNOSIS — R928 Other abnormal and inconclusive findings on diagnostic imaging of breast: Secondary | ICD-10-CM | POA: Diagnosis not present

## 2016-02-23 DIAGNOSIS — N6320 Unspecified lump in the left breast, unspecified quadrant: Secondary | ICD-10-CM

## 2016-02-23 HISTORY — PX: BREAST EXCISIONAL BIOPSY: SUR124

## 2016-02-24 ENCOUNTER — Ambulatory Visit (HOSPITAL_BASED_OUTPATIENT_CLINIC_OR_DEPARTMENT_OTHER): Payer: 59 | Admitting: Certified Registered"

## 2016-02-24 ENCOUNTER — Ambulatory Visit (HOSPITAL_BASED_OUTPATIENT_CLINIC_OR_DEPARTMENT_OTHER)
Admission: RE | Admit: 2016-02-24 | Discharge: 2016-02-24 | Disposition: A | Discharge status: Home / Self Care | Payer: 59 | Source: Ambulatory Visit | Attending: General Surgery | Admitting: General Surgery

## 2016-02-24 ENCOUNTER — Encounter (HOSPITAL_BASED_OUTPATIENT_CLINIC_OR_DEPARTMENT_OTHER)
Admission: RE | Disposition: A | Discharge status: Home / Self Care | Payer: Self-pay | Source: Ambulatory Visit | Attending: General Surgery

## 2016-02-24 ENCOUNTER — Encounter (HOSPITAL_BASED_OUTPATIENT_CLINIC_OR_DEPARTMENT_OTHER): Payer: Self-pay | Admitting: Certified Registered"

## 2016-02-24 ENCOUNTER — Ambulatory Visit
Admission: RE | Admit: 2016-02-24 | Discharge: 2016-02-24 | Disposition: A | Discharge status: Home / Self Care | Payer: 59 | Source: Ambulatory Visit | Attending: General Surgery | Admitting: General Surgery

## 2016-02-24 DIAGNOSIS — N6310 Unspecified lump in the right breast, unspecified quadrant: Secondary | ICD-10-CM

## 2016-02-24 DIAGNOSIS — N6012 Diffuse cystic mastopathy of left breast: Secondary | ICD-10-CM | POA: Diagnosis not present

## 2016-02-24 DIAGNOSIS — N6092 Unspecified benign mammary dysplasia of left breast: Secondary | ICD-10-CM | POA: Insufficient documentation

## 2016-02-24 DIAGNOSIS — N63 Unspecified lump in breast: Secondary | ICD-10-CM | POA: Diagnosis not present

## 2016-02-24 DIAGNOSIS — N6489 Other specified disorders of breast: Secondary | ICD-10-CM | POA: Insufficient documentation

## 2016-02-24 DIAGNOSIS — N6082 Other benign mammary dysplasias of left breast: Secondary | ICD-10-CM | POA: Diagnosis not present

## 2016-02-24 DIAGNOSIS — R928 Other abnormal and inconclusive findings on diagnostic imaging of breast: Secondary | ICD-10-CM | POA: Diagnosis not present

## 2016-02-24 HISTORY — PX: RADIOACTIVE SEED GUIDED EXCISIONAL BREAST BIOPSY: SHX6490

## 2016-02-24 SURGERY — RADIOACTIVE SEED GUIDED BREAST BIOPSY
Anesthesia: General | Site: Breast | Laterality: Left

## 2016-02-24 MED ORDER — LIDOCAINE HCL (CARDIAC) 20 MG/ML IV SOLN: INTRAVENOUS | Status: DC | PRN

## 2016-02-24 MED ORDER — SCOPOLAMINE 1 MG/3DAYS TD PT72: 1.0000 | MEDICATED_PATCH | Freq: Once | TRANSDERMAL | Status: DC | PRN

## 2016-02-24 MED ORDER — CEFAZOLIN SODIUM-DEXTROSE 2-4 GM/100ML-% IV SOLN
2.0000 g | INTRAVENOUS | Status: AC
  Administered 2016-02-24: 2 g via INTRAVENOUS

## 2016-02-24 MED ORDER — FENTANYL CITRATE (PF) 100 MCG/2ML IJ SOLN
50.0000 ug | INTRAMUSCULAR | Status: DC | PRN
  Administered 2016-02-24 (×2): 50 ug via INTRAVENOUS

## 2016-02-24 MED ORDER — PROPOFOL 500 MG/50ML IV EMUL
INTRAVENOUS | Status: AC
  Filled 2016-02-24: qty 50

## 2016-02-24 MED ORDER — BUPIVACAINE HCL (PF) 0.25 % IJ SOLN
INTRAMUSCULAR | Status: AC
  Filled 2016-02-24: qty 210

## 2016-02-24 MED ORDER — FENTANYL CITRATE (PF) 100 MCG/2ML IJ SOLN
INTRAMUSCULAR | Status: AC
  Filled 2016-02-24: qty 2

## 2016-02-24 MED ORDER — LIDOCAINE 2% (20 MG/ML) 5 ML SYRINGE
INTRAMUSCULAR | Status: AC
  Filled 2016-02-24: qty 5

## 2016-02-24 MED ORDER — ONDANSETRON HCL 4 MG/2ML IJ SOLN: 4.0000 mg | Freq: Four times a day (QID) | INTRAMUSCULAR | Status: DC | PRN

## 2016-02-24 MED ORDER — EPHEDRINE SULFATE 50 MG/ML IJ SOLN
INTRAMUSCULAR | Status: DC | PRN
  Administered 2016-02-24 (×2): 5 mg via INTRAVENOUS

## 2016-02-24 MED ORDER — LIDOCAINE 2% (20 MG/ML) 5 ML SYRINGE
INTRAMUSCULAR | Status: DC | PRN
  Administered 2016-02-24: 80 mg via INTRAVENOUS

## 2016-02-24 MED ORDER — OXYCODONE HCL 5 MG PO TABS: 5.0000 mg | ORAL_TABLET | Freq: Once | ORAL | Status: DC | PRN

## 2016-02-24 MED ORDER — HYDROCODONE-ACETAMINOPHEN 5-325 MG PO TABS
1.0000 | ORAL_TABLET | Freq: Once | ORAL | Status: AC
  Administered 2016-02-24: 1 via ORAL

## 2016-02-24 MED ORDER — OXYCODONE HCL 5 MG PO TABS
ORAL_TABLET | ORAL | Status: AC
  Filled 2016-02-24: qty 1

## 2016-02-24 MED ORDER — ONDANSETRON HCL 4 MG/2ML IJ SOLN
INTRAMUSCULAR | Status: AC
  Filled 2016-02-24: qty 2

## 2016-02-24 MED ORDER — MIDAZOLAM HCL 2 MG/2ML IJ SOLN
INTRAMUSCULAR | Status: AC
  Filled 2016-02-24: qty 2

## 2016-02-24 MED ORDER — DEXAMETHASONE SODIUM PHOSPHATE 4 MG/ML IJ SOLN
INTRAMUSCULAR | Status: DC | PRN
  Administered 2016-02-24: 10 mg via INTRAVENOUS

## 2016-02-24 MED ORDER — CEFAZOLIN SODIUM-DEXTROSE 2-4 GM/100ML-% IV SOLN
INTRAVENOUS | Status: AC
  Filled 2016-02-24: qty 100

## 2016-02-24 MED ORDER — FENTANYL CITRATE (PF) 100 MCG/2ML IJ SOLN: 50.0000 ug | INTRAMUSCULAR | Status: DC | PRN

## 2016-02-24 MED ORDER — HYDROCODONE-ACETAMINOPHEN 5-325 MG PO TABS
ORAL_TABLET | ORAL | Status: AC
  Filled 2016-02-24: qty 1

## 2016-02-24 MED ORDER — METHYLENE BLUE 0.5 % INJ SOLN
INTRAVENOUS | Status: AC
  Filled 2016-02-24: qty 10

## 2016-02-24 MED ORDER — ATROPINE SULFATE 0.4 MG/ML IJ SOLN
INTRAMUSCULAR | Status: AC
  Filled 2016-02-24: qty 1

## 2016-02-24 MED ORDER — SODIUM CHLORIDE 0.9 % IJ SOLN
INTRAMUSCULAR | Status: AC
  Filled 2016-02-24: qty 20

## 2016-02-24 MED ORDER — ONDANSETRON HCL 4 MG/2ML IJ SOLN
INTRAMUSCULAR | Status: DC | PRN
  Administered 2016-02-24: 4 mg via INTRAVENOUS

## 2016-02-24 MED ORDER — DEXAMETHASONE SODIUM PHOSPHATE 10 MG/ML IJ SOLN
INTRAMUSCULAR | Status: AC
  Filled 2016-02-24: qty 1

## 2016-02-24 MED ORDER — OXYCODONE HCL 5 MG/5ML PO SOLN: 5.0000 mg | Freq: Once | ORAL | Status: DC | PRN

## 2016-02-24 MED ORDER — PROPOFOL 10 MG/ML IV BOLUS
INTRAVENOUS | Status: DC | PRN
Start: 2016-02-24 — End: 2016-02-24
  Administered 2016-02-24: 140 mg via INTRAVENOUS

## 2016-02-24 MED ORDER — EPHEDRINE 5 MG/ML INJ
INTRAVENOUS | Status: AC
  Filled 2016-02-24: qty 10

## 2016-02-24 MED ORDER — LACTATED RINGERS IV SOLN
INTRAVENOUS | Status: DC
  Administered 2016-02-24 (×2): via INTRAVENOUS

## 2016-02-24 MED ORDER — GLYCOPYRROLATE 0.2 MG/ML IJ SOLN: 0.2000 mg | Freq: Once | INTRAMUSCULAR | Status: DC | PRN

## 2016-02-24 MED ORDER — BUPIVACAINE HCL (PF) 0.25 % IJ SOLN
INTRAMUSCULAR | Status: DC | PRN
Start: 2016-02-24 — End: 2016-02-24
  Administered 2016-02-24: 20 mL

## 2016-02-24 MED ORDER — HYDROCODONE-ACETAMINOPHEN 10-325 MG PO TABS: 1.0000 | ORAL_TABLET | Freq: Four times a day (QID) | ORAL | Status: DC | PRN

## 2016-02-24 MED ORDER — MIDAZOLAM HCL 2 MG/2ML IJ SOLN
1.0000 mg | INTRAMUSCULAR | Status: DC | PRN
  Administered 2016-02-24: 1 mg via INTRAVENOUS

## 2016-02-24 MED FILL — HYDROCODON-APAP 10-325: 10-325 | 5 days supply | Qty: 20 | Fill #0

## 2016-02-24 MED FILL — ONDANSETRON ODT 4 MG TABLET: 4 | 3 days supply | Qty: 15 | Fill #0

## 2016-02-24 SURGICAL SUPPLY — 56 items
APPLIER CLIP 9.375 MED OPEN (MISCELLANEOUS) ×3
BINDER BREAST LRG (GAUZE/BANDAGES/DRESSINGS) IMPLANT
BINDER BREAST MEDIUM (GAUZE/BANDAGES/DRESSINGS) ×3 IMPLANT
BINDER BREAST XLRG (GAUZE/BANDAGES/DRESSINGS) IMPLANT
BINDER BREAST XXLRG (GAUZE/BANDAGES/DRESSINGS) IMPLANT
BLADE SURG 15 STRL LF DISP TIS (BLADE) ×1 IMPLANT
BLADE SURG 15 STRL SS (BLADE) ×2
CANISTER SUC SOCK COL 7IN (MISCELLANEOUS) IMPLANT
CANISTER SUCT 1200ML W/VALVE (MISCELLANEOUS) IMPLANT
CHLORAPREP W/TINT 26ML (MISCELLANEOUS) ×3 IMPLANT
CLIP APPLIE 9.375 MED OPEN (MISCELLANEOUS) ×1 IMPLANT
CLOSURE WOUND 1/2 X4 (GAUZE/BANDAGES/DRESSINGS) ×1
COVER BACK TABLE 60X90IN (DRAPES) ×3 IMPLANT
COVER MAYO STAND STRL (DRAPES) ×3 IMPLANT
COVER PROBE W GEL 5X96 (DRAPES) ×3 IMPLANT
DECANTER SPIKE VIAL GLASS SM (MISCELLANEOUS) IMPLANT
DEVICE DUBIN W/COMP PLATE 8390 (MISCELLANEOUS) ×3 IMPLANT
DRAPE LAPAROSCOPIC ABDOMINAL (DRAPES) ×3 IMPLANT
DRAPE UTILITY XL STRL (DRAPES) ×3 IMPLANT
DRSG TEGADERM 4X4.75 (GAUZE/BANDAGES/DRESSINGS) IMPLANT
ELECT COATED BLADE 2.86 ST (ELECTRODE) ×3 IMPLANT
ELECT REM PT RETURN 9FT ADLT (ELECTROSURGICAL) ×3
ELECTRODE REM PT RTRN 9FT ADLT (ELECTROSURGICAL) ×1 IMPLANT
GLOVE BIO SURGEON STRL SZ7 (GLOVE) ×6 IMPLANT
GLOVE BIOGEL PI IND STRL 7.5 (GLOVE) ×1 IMPLANT
GLOVE BIOGEL PI INDICATOR 7.5 (GLOVE) ×2
GOWN STRL REUS W/ TWL LRG LVL3 (GOWN DISPOSABLE) ×2 IMPLANT
GOWN STRL REUS W/TWL LRG LVL3 (GOWN DISPOSABLE) ×4
ILLUMINATOR WAVEGUIDE N/F (MISCELLANEOUS) ×3 IMPLANT
KIT MARKER MARGIN INK (KITS) ×3 IMPLANT
LIGHT WAVEGUIDE WIDE FLAT (MISCELLANEOUS) IMPLANT
LIQUID BAND (GAUZE/BANDAGES/DRESSINGS) ×3 IMPLANT
NEEDLE HYPO 25X1 1.5 SAFETY (NEEDLE) ×3 IMPLANT
NS IRRIG 1000ML POUR BTL (IV SOLUTION) IMPLANT
PACK BASIN DAY SURGERY FS (CUSTOM PROCEDURE TRAY) ×3 IMPLANT
PENCIL BUTTON HOLSTER BLD 10FT (ELECTRODE) ×3 IMPLANT
SHEET MEDIUM DRAPE 40X70 STRL (DRAPES) IMPLANT
SLEEVE SCD COMPRESS KNEE MED (MISCELLANEOUS) ×3 IMPLANT
SPONGE GAUZE 4X4 12PLY STER LF (GAUZE/BANDAGES/DRESSINGS) IMPLANT
SPONGE LAP 4X18 X RAY DECT (DISPOSABLE) ×3 IMPLANT
STRIP CLOSURE SKIN 1/2X4 (GAUZE/BANDAGES/DRESSINGS) ×2 IMPLANT
SUT MNCRL AB 4-0 PS2 18 (SUTURE) ×3 IMPLANT
SUT MON AB 5-0 PS2 18 (SUTURE) IMPLANT
SUT SILK 2 0 SH (SUTURE) IMPLANT
SUT VIC AB 2-0 SH 27 (SUTURE) ×4
SUT VIC AB 2-0 SH 27XBRD (SUTURE) ×2 IMPLANT
SUT VIC AB 3-0 SH 27 (SUTURE) ×2
SUT VIC AB 3-0 SH 27X BRD (SUTURE) ×1 IMPLANT
SUT VIC AB 5-0 PS2 18 (SUTURE) IMPLANT
SYR CONTROL 10ML LL (SYRINGE) ×3 IMPLANT
TAPE STRIPS DRAPE STRL (GAUZE/BANDAGES/DRESSINGS) ×3 IMPLANT
TOWEL OR 17X24 6PK STRL BLUE (TOWEL DISPOSABLE) ×3 IMPLANT
TOWEL OR NON WOVEN STRL DISP B (DISPOSABLE) ×3 IMPLANT
TUBE CONNECTING 20'X1/4 (TUBING)
TUBE CONNECTING 20X1/4 (TUBING) IMPLANT
YANKAUER SUCT BULB TIP NO VENT (SUCTIONS) IMPLANT

## 2016-02-24 NOTE — Transfer of Care (Signed)
Immediate Anesthesia Transfer of Care Note  Patient: Stacey Jordan  Procedure(s) Performed: Procedure(s) with comments: RADIOACTIVE SEED GUIDED EXCISIONAL LEFT BREAST BIOPSY (Left) - RADIOACTIVE SEED GUIDED EXCISIONAL LEFT BREAST BIOPSY  Patient Location: PACU  Anesthesia Type:General  Level of Consciousness: awake, alert  and oriented  Airway & Oxygen Therapy: Patient Spontanous Breathing and Patient connected to face mask oxygen  Post-op Assessment: Report given to RN, Post -op Vital signs reviewed and stable and Patient moving all extremities  Post vital signs: Reviewed and stable  Last Vitals:  Filed Vitals:   02/24/16 0645  BP: 107/72  Pulse: 80  Temp: 36.8 C  Resp: 18    Last Pain: There were no vitals filed for this visit.    Patients Stated Pain Goal: 0 ([id number])  Complications: No apparent anesthesia complications

## 2016-02-24 NOTE — Anesthesia Preprocedure Evaluation (Signed)
Anesthesia Evaluation  Patient identified by MRN, date of birth, ID band Patient awake    Reviewed: Allergy & Precautions, NPO status , Patient's Chart, lab work & pertinent test results  History of Anesthesia Complications (+) PONV  Airway Mallampati: II   Neck ROM: full    Dental   Pulmonary neg pulmonary ROS,    breath sounds clear to auscultation       Cardiovascular negative cardio ROS   Rhythm:regular Rate:Normal     Neuro/Psych    GI/Hepatic   Endo/Other    Renal/GU      Musculoskeletal  (+) Arthritis ,   Abdominal   Peds  Hematology   Anesthesia Other Findings   Reproductive/Obstetrics                             Anesthesia Physical Anesthesia Plan  ASA: II  Anesthesia Plan: General   Post-op Pain Management:    Induction: Intravenous  Airway Management Planned: LMA  Additional Equipment:   Intra-op Plan:   Post-operative Plan:   Informed Consent: I have reviewed the patients History and Physical, chart, labs and discussed the procedure including the risks, benefits and alternatives for the proposed anesthesia with the patient or authorized representative who has indicated his/her understanding and acceptance.     Plan Discussed with: CRNA, Anesthesiologist and Surgeon  Anesthesia Plan Comments:         Anesthesia Quick Evaluation

## 2016-02-24 NOTE — Op Note (Signed)
Preoperative diagnoses:Left breast seed guided excisional biopsy Postoperative diagnosis: Same as above Procedure:Left breast seed guided excisional biopsy Surgeon: Dr. Serita Grammes Anesthesia: Gen. Estimated blood loss: Minimal Complications: None Drains: None Specimens:left breast tissue marked with paint Sponge and needle count correct at completion Disposition to recovery stable  Indications: This is a 12 yof who has long history of a left breast mass that has gotten larger.  She had a mm that showed a concerning area that was biopsied and was benign She is referred for excision.  We did have a seed placed in the biopsied area to make sure this was excised.   Procedure:After informed consent was obtained she was then taken to the operating room. She was given cefazolin. Sequential compression devices on her legs. She was placed under general anesthesia without complication. Her left breast was then prepped and draped in the standard sterile surgical fashion. A surgical timeout was then performed.  I located the radioactive seed with the neoprobe.I infiltrated marcaine in the inframammary fold and made an incision. I tunneled to the palpable mass using the lighted retractors.   I then used the neoprobe to guide the excision of the seed and surrounding tissue. The palpable mass was removed also as the seed was in the posterolateral portion.  The mass was very adherent to the skin and surrounding tissue. I used scissors to remove it from the skin.  This was confirmed by the neoprobe. This was painted. This was then taken for mammogram which confirmed removal of the clip and the seed. This was confirmed by radiology. This was then sent to pathology. Hemostasis was observed. I closed the breast tissue with a 2-0 Vicryl. The dermis was closed with 3-0 Vicryl the skin with 4-0 Monocryl. Dermabond was placed. She was transferred to recovery stable.

## 2016-02-24 NOTE — H&P (Signed)
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 she was recently reevalauted by gyn and send for mm. this shows c density breasts. there is solid mobile mass in left breast. in lateral left breast there is a ribbon shpwed clip but there is new focal area of distortion present. US shows a 1.8x0.9x1.1 cm mass in left breast thought to be distortion located on the deep margin of a larger area measuring 2.7x2.4x1.6 cm. [place] of axilla is negative. this underwent US guided core biopsy. the result is fcc with pash. clip is in good position. the path result is discordant. she is referred here for evaluation. she is here with her husband   Other Problems Erasmo Leventhal, RN, BSN; 02/14/2016 3:13 PM) Back Pain Lump In Breast  Past Surgical History Erasmo Leventhal, RN, BSN; 02/14/2016 3:13 PM) Spinal Surgery - Lower Back  Diagnostic Studies History Erasmo Leventhal, RN, BSN; 02/14/2016 3:13 PM) Colonoscopy never Mammogram within last year Pap Smear 1-5 years ago  Allergies Erasmo Leventhal, RN, BSN; 02/14/2016 3:14 PM) No Known Drug Allergies06/08/2016  Medication History Erasmo Leventhal, RN, BSN; 02/14/2016 3:15 PM) No Current Medications Medications Reconciled  Social History Erasmo Leventhal, RN, BSN; 02/14/2016 3:13 PM) Alcohol use Occasional alcohol use. Caffeine use Coffee. No drug use Tobacco use Never smoker.  Family History (Erasmo Leventhal, RN, BSN; 02/14/2016 3:13 PM) Anesthetic complications Father. Arthritis Father. Bleeding disorder Mother. Heart Disease Father. Heart disease in female family member before age 35 Ischemic Bowel Disease Mother. Respiratory Condition Daughter.  Pregnancy / Birth History (Erasmo Leventhal, RN, BSN; 02/14/2016 3:13 PM) Age at menarche 8 years. Contraceptive History Intrauterine device. Gravida 2 Irregular  periods Maternal age 70-30 Para 2    Review of Systems Occupational hygienist, BSN; 02/14/2016 3:14 PM) General Not Present- Appetite Loss, Chills, Fatigue, Fever, Night Sweats, Weight Gain and Weight Loss. Skin Not Present- Change in Wart/Mole, Dryness, Hives, Jaundice, New Lesions, Non-Healing Wounds, Rash and Ulcer. HEENT Present- Wears glasses/contact lenses. Not Present- Earache, Hearing Loss, Hoarseness, Nose Bleed, Oral Ulcers, Ringing in the Ears, Seasonal Allergies, Sinus Pain, Sore Throat, Visual Disturbances and Yellow Eyes. Respiratory Not Present- Bloody sputum, Chronic Cough, Difficulty Breathing, Snoring and Wheezing. Breast Present- Breast Mass and Breast Pain. Not Present- Nipple Discharge and Skin Changes. Cardiovascular Not Present- Chest Pain, Difficulty Breathing Lying Down, Leg Cramps, Palpitations, Rapid Heart Rate, Shortness of Breath and Swelling of Extremities. Gastrointestinal Not Present- Abdominal Pain, Bloating, Bloody Stool, Change in Bowel Habits, Chronic diarrhea, Constipation, Difficulty Swallowing, Excessive gas, Gets full quickly at meals, Hemorrhoids, Indigestion, Nausea, Rectal Pain and Vomiting. Female Genitourinary Not Present- Frequency, Nocturia, Painful Urination, Pelvic Pain and Urgency. Musculoskeletal Not Present- Back Pain, Joint Pain, Joint Stiffness, Muscle Pain, Muscle Weakness and Swelling of Extremities. Neurological Not Present- Decreased Memory, Fainting, Headaches, Numbness, Seizures, Tingling, Tremor, Trouble walking and Weakness. Psychiatric Not Present- Anxiety, Bipolar, Change in Sleep Pattern, Depression, Fearful and Frequent crying. Endocrine Not Present- Cold Intolerance, Excessive Hunger, Hair Changes, Heat Intolerance, Hot flashes and New Diabetes. Hematology Not Present- Easy Bruising, Excessive bleeding, Gland problems, HIV and Persistent Infections.  Vitals Occupational hygienist, BSN; 02/14/2016 3:14 PM) 02/14/2016 3:14 PM Weight:  117.6 lb Height: 64in Body Surface Area: 1.56 m Body Mass Index: 20.19 kg/m  Temp.: 98.64F(Oral)  Pulse: 81 (Regular)  BP: 101/64 (Sitting, Left Arm, Standard)       Physical Exam Rolm Bookbinder  MD; 02/16/2016 2:41 PM) General Mental Status-Alert. Orientation-Oriented X3.  Chest and Lung Exam Chest and lung exam reveals -on auscultation, normal breath sounds, no adventitious sounds and normal vocal resonance.  Breast Nipples-No Discharge.   Cardiovascular Cardiovascular examination reveals -normal heart sounds, regular rate and rhythm with no murmurs.    Assessment & Plan Rolm Bookbinder MD; 02/16/2016 2:41 PM) MASS OF LEFT BREAST ON MAMMOGRAM (N63) Story: left breast seed guided excisional biopsy I recommended excision of the mass and using radioactive seed to make sure I remove the area of distortion as well. I think this needs to be done for diagnosis. we discussed risks, recovery, will proceed

## 2016-02-24 NOTE — Discharge Instructions (Signed)
[facility] °Office Phone Number [phone] ° °POST OP INSTRUCTIONS ° °Always review your discharge instruction sheet given to you by the facility where your surgery was performed. ° °IF YOU HAVE DISABILITY OR FAMILY LEAVE FORMS, YOU MUST BRING THEM TO THE OFFICE FOR PROCESSING.  DO NOT GIVE THEM TO YOUR DOCTOR. ° °1. A prescription for pain medication may be given to you upon discharge.  Take your pain medication as prescribed, if needed.  If narcotic pain medicine is not needed, then you may take acetaminophen (Tylenol), naprosyn (Alleve) or ibuprofen (Advil) as needed. °2. Take your usually prescribed medications unless otherwise directed °3. If you need a refill on your pain medication, please contact your pharmacy.  They will contact our office to request authorization.  Prescriptions will not be filled after 5pm or on week-ends. °4. You should eat very light the first 24 hours after surgery, such as soup, crackers, pudding, etc.  Resume your normal diet the day after surgery. °5. Most patients will experience some swelling and bruising in the breast.  Ice packs and a good support bra will help.  Wear the breast binder provided or a sports bra for 72 hours day and night.  After that wear a sports bra during the day until you return to the office. Swelling and bruising can take several days to resolve.  °6. It is common to experience some constipation if taking pain medication after surgery.  Increasing fluid intake and taking a stool softener will usually help or prevent this problem from occurring.  A mild laxative (Milk of Magnesia or Miralax) should be taken according to package directions if there are no bowel movements after 48 hours. °7. Unless discharge instructions indicate otherwise, you may remove your bandages 48 hours after surgery and you may shower at that time.  You may have steri-strips (small skin tapes) in place directly over the incision.  These strips should be left on the  skin for 7-10 days and will come off on their own.  If your surgeon used skin glue on the incision, you may shower in 24 hours.  The glue will flake off over the next 2-3 weeks.  Any sutures or staples will be removed at the office during your follow-up visit. °8. ACTIVITIES:  You may resume regular daily activities (gradually increasing) beginning the next day.  Wearing a good support bra or sports bra minimizes pain and swelling.  You may have sexual intercourse when it is comfortable. °a. You may drive when you no longer are taking prescription pain medication, you can comfortably wear a seatbelt, and you can safely maneuver your car and apply brakes. °b. RETURN TO WORK:  ______________________________________________________________________________________ °9. You should see your doctor in the office for a follow-up appointment approximately two weeks after your surgery.  Your doctor’s nurse will typically make your follow-up appointment when she calls you with your pathology report.  Expect your pathology report 3-4 business days after your surgery.  You may call to check if you do not hear from us after three days. °10. OTHER INSTRUCTIONS: _______________________________________________________________________________________________ _____________________________________________________________________________________________________________________________________ °_____________________________________________________________________________________________________________________________________ °_____________________________________________________________________________________________________________________________________ ° °WHEN TO CALL DR WAKEFIELD: °1. Fever over 101.0 °2. Nausea and/or vomiting. °3. Extreme swelling or bruising. °4. Continued bleeding from incision. °5. Increased pain, redness, or drainage from the incision. ° °The clinic staff is available to answer your questions during regular  business hours.  Please don’t hesitate to call and ask to speak to one of the nurses for clinical concerns.  If   you have a medical emergency, go to the nearest emergency room or call 911.  A surgeon from [facility] is always on call at the hospital. ° °For further questions, please visit [email or web address] ° ° ° °Post Anesthesia Home Care Instructions ° °Activity: °Get plenty of rest for the remainder of the day. A responsible adult should stay with you for 24 hours following the procedure.  °For the next 24 hours, DO NOT: °-Drive a car °-Operate machinery °-Drink alcoholic beverages °-Take any medication unless instructed by your physician °-Make any legal decisions or sign important papers. ° °Meals: °Start with liquid foods such as gelatin or soup. Progress to regular foods as tolerated. Avoid greasy, spicy, heavy foods. If nausea and/or vomiting occur, drink only clear liquids until the nausea and/or vomiting subsides. Call your physician if vomiting continues. ° °Special Instructions/Symptoms: °Your throat may feel dry or sore from the anesthesia or the breathing tube placed in your throat during surgery. If this causes discomfort, gargle with warm salt water. The discomfort should disappear within 24 hours. ° °If you had a scopolamine patch placed behind your ear for the management of post- operative nausea and/or vomiting: ° °1. The medication in the patch is effective for 72 hours, after which it should be removed.  Wrap patch in a tissue and discard in the trash. Wash hands thoroughly with soap and water. °2. You may remove the patch earlier than 72 hours if you experience unpleasant side effects which may include dry mouth, dizziness or visual disturbances. °3. Avoid touching the patch. Wash your hands with soap and water after contact with the patch. °  ° °

## 2016-02-24 NOTE — Interval H&P Note (Signed)
History and Physical Interval Note:  02/24/2016 7:24 AM  Stacey Jordan  has presented today for surgery, with the diagnosis of LEFT BREAST MASS  The various methods of treatment have been discussed with the patient and family. After consideration of risks, benefits and other options for treatment, the patient has consented to  Procedure(s): RADIOACTIVE SEED GUIDED EXCISIONAL LEFT BREAST BIOPSY (Left) as a surgical intervention .  The patient's history has been reviewed, patient examined, no change in status, stable for surgery.  I have reviewed the patient's chart and labs.  Questions were answered to the patient's satisfaction.     Leomia Blake

## 2016-02-24 NOTE — Anesthesia Postprocedure Evaluation (Signed)
Anesthesia Post Note  Patient: Stacey MILLIKIN  Procedure(s) Performed: Procedure(s) (LRB): RADIOACTIVE SEED GUIDED EXCISIONAL LEFT BREAST BIOPSY (Left)  Patient location during evaluation: PACU Anesthesia Type: General Level of consciousness: awake and alert and patient cooperative Pain management: pain level controlled Vital Signs Assessment: post-procedure vital signs reviewed and stable Respiratory status: spontaneous breathing and respiratory function stable Cardiovascular status: stable Anesthetic complications: no    Last Vitals:  Filed Vitals:   02/24/16 0915 02/24/16 0924  BP: 120/75   Pulse: 91 88  Temp:    Resp: 17 17    Last Pain:  Filed Vitals:   02/24/16 0924  PainSc: [place]

## 2016-02-24 NOTE — Anesthesia Procedure Notes (Signed)
Procedure Name: LMA Insertion Date/Time: 02/24/2016 7:32 AM Performed by: Baxter Flattery Pre-anesthesia Checklist: Patient identified, Emergency Drugs available, Suction available and Patient being monitored Patient Re-evaluated:Patient Re-evaluated prior to inductionOxygen Delivery Method: Circle system utilized Preoxygenation: Pre-oxygenation with 100% oxygen Intubation Type: IV induction Ventilation: Mask ventilation without difficulty LMA: LMA inserted LMA Size: 3.0 Number of attempts: 1 Airway Equipment and Method: Bite block Placement Confirmation: positive ETCO2 and breath sounds checked- equal and bilateral Tube secured with: Tape Dental Injury: Teeth and Oropharynx as per pre-operative assessment

## 2016-02-25 ENCOUNTER — Encounter (HOSPITAL_BASED_OUTPATIENT_CLINIC_OR_DEPARTMENT_OTHER): Payer: Self-pay | Admitting: General Surgery

## 2016-06-08 ENCOUNTER — Telehealth: Payer: Self-pay | Admitting: Obstetrics and Gynecology

## 2016-06-08 NOTE — Telephone Encounter (Signed)
Left message to call Stacey Jordan at [phone]. 

## 2016-06-08 NOTE — Telephone Encounter (Signed)
Left message to call [place] at [phone].  Please see telephone encounter dated 01/27/2016 with note from [place]. Records from [facility] received. Patient had her Mirena placed on 05/08/2012. Mirena is due for removal and replacement on 05/08/2017.

## 2016-06-08 NOTE — Telephone Encounter (Signed)
Patient wanting to check if it's time to have her iud removed and replaced.

## 2016-06-08 NOTE — Telephone Encounter (Signed)
Patient is returning a call to Kaitlyn. °

## 2016-06-13 NOTE — Telephone Encounter (Signed)
Spoke with patient. Advised patient her Mirena IUD is due for removal and replacement by 05/08/2017. Patient is agreeable and verbalizes understanding.  Routing to provider for final review. Patient agreeable to disposition. Will close encounter.

## 2016-07-07 ENCOUNTER — Ambulatory Visit (INDEPENDENT_AMBULATORY_CARE_PROVIDER_SITE_OTHER): Payer: 59 | Admitting: Sports Medicine

## 2016-07-07 ENCOUNTER — Encounter: Payer: Self-pay | Admitting: Sports Medicine

## 2016-07-07 ENCOUNTER — Ambulatory Visit: Payer: Self-pay

## 2016-07-07 VITALS — BP 113/68 | Ht 64.0 in | Wt 116.0 lb

## 2016-07-07 DIAGNOSIS — M25511 Pain in right shoulder: Secondary | ICD-10-CM

## 2016-07-07 MED ORDER — METHYLPREDNISOLONE ACETATE 40 MG/ML IJ SUSP
40.0000 mg | Freq: Once | INTRAMUSCULAR | Status: AC
  Administered 2016-07-07: 40 mg via INTRA_ARTICULAR

## 2016-07-07 NOTE — Assessment & Plan Note (Signed)
Most likely her clinical findings and ultrasound findings are consistent with a subacromial bursitis and supraspinatus tendinopathy. Nothing on exam to suspect these as capsulitis - Injection today - Provided home exercises - She will call back if she would like to have physical therapy. - She will follow-up in 3 weeks. If no improvement consider an MRI or x-ray at that time.

## 2016-07-07 NOTE — Progress Notes (Signed)
Stacey Jordan - 45 y.o. female MRN [id number]  Date of birth: 1971/09/03  SUBJECTIVE:  Including CC & ROS.  Chief Complaint  Patient presents with  . Shoulder Pain     Stacey Jordan is a year-old female that is presenting with right shoulder pain. The pain started roughly a year ago but has significantly worse over the past month. She reports initially started having pain when she is shooting a basketball about a year ago. During that time the pain is becoming more constant in nature. It is now keeping her up at night. She is having to take ibuprofen to help alleviate the pain. She has the pain on the anterior lateral aspect of her shoulder. She denies any prior injury or surgery to her shoulder. She denies any weakness in her hand or altered sensation going down into her hand.  ROS: No unexpected weight loss, fever, chills, swelling, instability, numbness/tingling, redness, otherwise see HPI   HISTORY: Past Medical, Surgical, Social, and Family History Reviewed & Updated per EMR.   Pertinent Historical Findings include: PMSHx -  Discectomy L5-S1 PSHx -  No tobacco use, occasional alcohol use, she is a physical therapist.  FHx -  Heart disease, OA  Medications - ibuprofen   DATA REVIEWED: None to review   PHYSICAL EXAM:  VS: BP:113/68  HR: bpm  TEMP: ( )  RESP:   HT:5\' 4"  (162.6 cm)   WT:116 lb (52.6 kg)  BMI:20 PHYSICAL EXAM: Gen: NAD, alert, cooperative with exam, well-appearing HEENT: clear conjunctiva, EOMI CV:  no edema, capillary refill brisk,  Resp: non-labored, normal speech Skin: no rashes, normal turgor  Neuro: no gross deficits.  Psych:  alert and oriented Right Shoulder:  No tenderness palpation over the biceps tendon or the acromion clavicular joint. Normal flexion and abduction actively and passively. Normal scapular function. Normal strength to resistance with internal and sternal rotation. Positive empty can and Hawkins test. Normal grip  strength. Neurovascular intact.  Limited ultrasound: Right shoulder: The biceps tendon was viewed as long and short axis and appears to be normal. The subscapularis was viewed in long axis and found to be normal. Dynamic testing of the subscapularis revealed to be normal. The supraspinatus had some hypoechoic changes within the tendon to suggest a tendinopathy. There is also a slight enlargement of the bursa to suggest a bursitis. Dynamic testing of supraspinatus did not reveal catching or impingement. Infraspinatus and the posterior glenohumeral joint or found to be normal. The acromioclavicular joint was normal in appearance. These findings are consistent with the supraspinatus tendinopathy and subacromial bursitis.   Aspiration/Injection Procedure Note Stacey Jordan October 14, 1970  Procedure: Injection Indications: Right shoulder pain  Procedure Details Consent: Risks of procedure as well as the alternatives and risks of each were explained to the (patient/caregiver).  Consent for procedure obtained. Time Out: Verified patient identification, verified procedure, site/side was marked, verified correct patient position, special equipment/implants available, medications/allergies/relevent history reviewed, required imaging and test results available.  Performed.  The area was cleaned with iodine and alcohol swabs.    The right shoulder subacromial space was injected using 1 cc's of 40 mg Depomedrol and 4 cc's of 1% lidocaine with a 21 1 1/2" needle.  Ultrasound wasn't used.   A sterile dressing was applied.  Patient did tolerate procedure well.   ASSESSMENT & PLAN:   Right shoulder pain Most likely her clinical findings and ultrasound findings are consistent with a subacromial bursitis and supraspinatus tendinopathy. Nothing on exam to  suspect these as capsulitis - Injection today - Provided home exercises - She will call back if she would like to have physical therapy. - She will  follow-up in 3 weeks. If no improvement consider an MRI or x-ray at that time.

## 2016-07-11 DIAGNOSIS — R110 Nausea: Secondary | ICD-10-CM | POA: Diagnosis not present

## 2016-07-19 ENCOUNTER — Encounter: Payer: Self-pay | Admitting: *Deleted

## 2016-07-19 ENCOUNTER — Other Ambulatory Visit: Payer: Self-pay | Admitting: *Deleted

## 2016-07-19 DIAGNOSIS — M25511 Pain in right shoulder: Secondary | ICD-10-CM

## 2016-07-24 ENCOUNTER — Ambulatory Visit: Payer: 59 | Attending: Sports Medicine

## 2016-07-24 DIAGNOSIS — G8929 Other chronic pain: Secondary | ICD-10-CM | POA: Diagnosis not present

## 2016-07-24 DIAGNOSIS — R110 Nausea: Secondary | ICD-10-CM | POA: Diagnosis not present

## 2016-07-24 DIAGNOSIS — M6281 Muscle weakness (generalized): Secondary | ICD-10-CM | POA: Diagnosis not present

## 2016-07-24 DIAGNOSIS — M25511 Pain in right shoulder: Secondary | ICD-10-CM | POA: Diagnosis not present

## 2016-07-24 DIAGNOSIS — M25611 Stiffness of right shoulder, not elsewhere classified: Secondary | ICD-10-CM | POA: Diagnosis not present

## 2016-07-24 NOTE — Patient Instructions (Addendum)
Posture - Standing   Good posture is important. Avoid slouching and forward head thrust. Maintain curve in low back and align ears over shoulders, hips over ankles.  Pull your belly button in toward your back bone. Posture Tips DO: - stand tall and erect - keep chin tucked in - keep head and shoulders in alignment - check posture regularly in mirror or large window - pull head back against headrest in car seat;  Change your position often.  Sit with lumbar support. DON'T: - slouch or slump while watching TV or reading - sit, stand or lie in one position  for too long;  Sitting is especially hard on the spine so if you sit at a desk/use the computer, then stand up often! Copyright  VHI. All rights reserved.  Posture - Sitting  Sit upright, head facing forward. Try using a roll to support lower back. Keep shoulders relaxed, and avoid rounded back. Keep hips level with knees. Avoid crossing legs for long periods. Copyright  VHI. All rights reserved.  Chronic neck strain can develop because of poor posture and faulty work habits  Postural strain related to slumped sitting and forward head posture is a leading cause of headaches, neck and upper back pain  General strengthening and flexibility exercises are helpful in the treatment of neck pain.  Most importantly, you should learn to correct the posture that may be contributing to chronic pain.   Change positions frequently  Change your work or home environment to improve posture and mechanics.    Side Pull: Double Arm   On back, knees bent, feet flat. Arms perpendicular to body, shoulder level, elbows straight but relaxed. Pull arms out to sides, elbows straight. Resistance band comes across collarbones, hands toward floor. Hold momentarily. Slowly return to starting position. Repeat _10__ times. Band color _yellow____    Shoulder Rotation: Double Arm   On back, knees bent, feet flat, elbows tucked at sides, bent 90, hands palms up.  Pull hands apart and down toward floor, keeping elbows near sides. Hold momentarily. Slowly return to starting position. Repeat _10__ times. Band color __yellow    Copyright  VHI. All rights reserved.  Scapular Retraction (Standing)   With arms at sides, pinch shoulder blades together. Repeat __10__ times per set. Do _2___ sets per session. Do _many___ sessions per day.     [facility] [address], [place], [place] [address] Phone # [phone] Fax [phone]____

## 2016-07-24 NOTE — Therapy (Signed)
[facility] [address], [place], [place], [address] Phone: [phone]   Fax:  [phone]  Physical Therapy Evaluation  Patient Details  Name: Stacey Jordan MRN: [id number] Date of Birth: 02-04-1971 Referring Provider: Yvetta Coder, MD  Encounter Date: 07/24/2016      PT End of Session - 07/24/16 0845    Visit Number 1   Date for PT Re-Evaluation 09/18/16   PT Start Time 0803   PT Stop Time 0843   PT Time Calculation (min) 40 min   Activity Tolerance Patient tolerated treatment well   Behavior During Therapy [facility] for tasks assessed/performed      Past Medical History:  Diagnosis Date  . Complication of anesthesia    very sensitive to aneth and pain meds  . DDD (degenerative disc disease), lumbar   . Medical history non-contributory   . PONV (postoperative nausea and vomiting)     Past Surgical History:  Procedure Laterality Date  . BREAST SURGERY  2005   Benign Rt.breast Bx  . HEMILAMINOTOMY LUMBAR SPINE Left 06/03/2008   L5-S1; with microdiscectomy and medial facetectomy  . MASS EXCISION Right 04/03/2013   Procedure:  MASS EXCISION AND BIOPSY;  Surgeon: Linna Hoff, MD;  Location: [place];  Service: Orthopedics;  Laterality: Right;  . RADIOACTIVE SEED GUIDED EXCISIONAL BREAST BIOPSY Left 02/24/2016   Procedure: RADIOACTIVE SEED GUIDED EXCISIONAL LEFT BREAST BIOPSY;  Surgeon: Rolm Bookbinder, MD;  Location: [place];  Service: General;  Laterality: Left;  RADIOACTIVE SEED GUIDED EXCISIONAL LEFT BREAST BIOPSY  . WISDOM TOOTH EXTRACTION      There were no vitals filed for this visit.       Subjective Assessment - 07/24/16 0811    Subjective Pt is a Rt hand dominant female who presents to PT with Rt shoulder pain of a chronic nature that flared up 2 months ago without incident or injury.     Pertinent History cortizone injection: 07/07/16   Diagnostic tests  Ultrasound: subacromial bursitis and supraspinatus tendinopathy   Patient Stated Goals reduce pain at night, use Rt arm without pain.  Pt is currently using Rt UE 50% of normal   Currently in Pain? Yes   Pain Score 7   0/10 at rest, up to 7-8/10 brief pain with movement   Pain Location Shoulder   Pain Orientation Right   Pain Descriptors / Indicators Stabbing;Aching;Burning   Pain Type Acute pain   Pain Onset More than a month ago   Pain Frequency Constant   Aggravating Factors  sleep at night (waking 5x/night), movement into ER and abduction, midrange motion   Pain Relieving Factors ibuprofen, not using it            [facility] PT Assessment - 07/24/16 0001      Assessment   Medical Diagnosis acute pain of Rt shoulder   Referring Provider Yvetta Coder, MD   Onset Date/Surgical Date 05/24/16   Hand Dominance Right   Next MD Visit 07/31/16   Prior Therapy none     Precautions   Precautions None     Restrictions   Weight Bearing Restrictions No     Balance Screen   Has the patient fallen in the past 6 months No   Has the patient had a decrease in activity level because of a fear of falling?  No   Is the patient reluctant to leave their home because of a fear of falling?  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   AROM Assessment Site  Shoulder   Right/Left Shoulder Right   Right Shoulder Flexion 125 Degrees   Right Shoulder ABduction 135 Degrees   Right Shoulder Internal Rotation --  lacking 7 inches vs Lt   Right Shoulder External Rotation --  lacking 1 inch vs Lt     PROM   Overall PROM  Deficits   Overall PROM Comments Rt shoulder PROM is limited by pain.  Flexion to 90, abduction to 120, IR and ER limited by 50%.     Strength   Overall Strength Deficits   Overall Strength Comments Lt shoulder 4+/5   Strength Assessment Site Shoulder   Right/Left Shoulder Right   Right Shoulder Flexion 4-/5   Right Shoulder ABduction 4/5   Right Shoulder Internal Rotation 4/5   Right Shoulder External Rotation 3+/5     Palpation   Palpation comment palpable tenderness at Rt [facility] joint, supraspinatus and external rotators                           PT Education - 07/24/16 0837    Education provided Yes   Education Details posture education, supine scapular stabilization   Person(s) Educated Patient   Methods Explanation;Demonstration;Handout   Comprehension Verbalized understanding;Returned demonstration          PT Short Term Goals - 07/24/16 0845      PT SHORT TERM GOAL #1   Title be independent in initial HEP   Time 4   Period Weeks   Status New     PT SHORT TERM GOAL #2   Title report 75% use of Rt UE with home and work tasks   Time 4   Period Weeks   Status New     PT SHORT TERM GOAL #3   Title sleep with 50% fewer interruptions due to Rt shoulder pain   Time 4   Period Weeks   Status New     PT SHORT TERM GOAL #4   Title demonstrate Rt shoulder flexion to 150 degrees to improve overhead reaching   Time 4     PT SHORT TERM GOAL #5   Period Weeks   Status New           PT Long Term Goals - 07/24/16 [id number]      PT LONG TERM GOAL #1   Title be independent in advanced HEP   Time 8   Period Weeks   Status New     PT LONG TERM GOAL #2   Title reduce FOTO to < or = to  31% limitation   Time 8   Period Weeks   Status New     PT LONG TERM GOAL #3   Title report >  or = to 90% use of Rt UE with home and work tasks   Time 8   Period Weeks   Status New     PT LONG TERM GOAL #4   Title report a 75% reduction in sleep interruptions due to Rt shoulder pain   Time 8   Period Weeks   Status New     PT LONG TERM GOAL #5   Title demonstrate Rt shoulder IR to lacking < or = to 3 inches to improve self-care   Time 8   Period Weeks   Status New     Additional Long Term Goals   Additional Long Term Goals Yes     PT LONG TERM GOAL #6   Title report < or = to 3/10 Rt shoulder pain with use   Time 8   Period Weeks   Status New               Plan - 07/24/16 0919    Clinical Impression Statement Pt is a Rt hand dominant female who presents to PT with Rt shoulder pain of a chronic nature that flared up 2 months ago without cause.  Pt demonstrates limited and painful Rt shouler A/ROM and P/ROM due to pain, weakness with testing on the Rt with pain, and weak scapular stabilizers.  Pt with palpable tenderness over Rt AC joint and external rotators.  Pain is rated 7/10 with movement and pt wakes up to 5/10 with pain.  Pt is low complexity evaluation due to lack of cormorbities, stable condition and one body part.  Pt will benefit from skilled PT for postural strength, shoulder A/ROM and pain management.     Rehab Potential Good   PT Frequency 2x / week   PT Duration 8 weeks   PT Treatment/Interventions ADLs/Self Care Home Management;Cryotherapy;Electrical Stimulation;Iontophoresis 4mg /ml Dexamethasone;Functional mobility training;Ultrasound;Moist Heat;Therapeutic activities;Therapeutic exercise;Patient/family education;Passive range of motion;Manual techniques;Dry needling;Taping   PT Next Visit Plan Scapular stabilization, isometrics for rotator cuff, shoulder stability, A/AROM, pain management   Consulted and Agree with Plan of Care Patient      Patient  will benefit from skilled therapeutic intervention in order to improve the following deficits and impairments:  Pain, Decreased strength, Impaired flexibility, Decreased activity tolerance, Impaired UE functional use, Decreased range of motion  Visit Diagnosis: Chronic right shoulder pain - Plan: PT plan of care cert/re-cert  Muscle weakness (generalized) - Plan: PT plan of care cert/re-cert  Stiffness of right shoulder, not elsewhere classified - Plan: PT plan of care cert/re-cert     Problem List Patient Active Problem List   Diagnosis Date Noted  . Right shoulder pain 07/07/2016  . Breast mass, right 02/14/2016  . Elbow pain, chronic 05/20/2013  . Lateral epicondylitis of right elbow 05/20/2013     Sigurd Sos, PT 07/24/16 9:27 AM  [facility] [address] [place], [place], [address] Phone: [phone]   Fax:  [phone]  Name: QUINDARA MENTA MRN: [id number] Date of Birth: [birth year]

## 2016-07-25 ENCOUNTER — Ambulatory Visit: Payer: Self-pay | Admitting: Physical Therapy

## 2016-07-31 ENCOUNTER — Ambulatory Visit: Payer: Self-pay | Admitting: Physical Therapy

## 2016-07-31 ENCOUNTER — Encounter: Payer: Self-pay | Admitting: Sports Medicine

## 2016-07-31 ENCOUNTER — Ambulatory Visit (INDEPENDENT_AMBULATORY_CARE_PROVIDER_SITE_OTHER): Payer: 59 | Admitting: Sports Medicine

## 2016-07-31 VITALS — BP 109/67 | HR 69 | Ht 64.0 in | Wt 116.0 lb

## 2016-07-31 DIAGNOSIS — M25511 Pain in right shoulder: Secondary | ICD-10-CM

## 2016-07-31 NOTE — Progress Notes (Signed)
   Subjective:    Patient ID: Stacey Jordan, female    DOB: 03/12/1971, 45 y.o.   MRN: [id number]  HPI   Patient comes in today for follow-up on right shoulder pain. Recent subacromial cortisone injection only provided her with a few days of symptom relief. Her pain has now returned. She has also started physical therapy but has not found it to be helpful. Pain continues to be diffuse around the right shoulder and worse with certain movements such as internal and external rotation. She also has pain with reaching directly overhead. Continues with nighttime pain. Her symptoms began acutely a little over a year ago and have only recently begun to worsen. Previous ultrasound showed sub-subacromial bursitis but no evidence of any significant rotator cuff pathology.    Review of Systems     Objective:   Physical Exam  Well-developed, well-nourished. No acute distress  Right shoulder: Patient has full passive and active range of motion but has a positive painful arc. Rotator cuff strength is 5/5 but reproducible of pain with resisted supraspinatus and external rotation. Markedly positive O'Brien. Positive clunk. No tenderness over the acromioclavicular joint. Neurovascularly intact distally.       Assessment & Plan:   Persistent right shoulder pain worrisome for labral tear  MRI arthrogram of the right shoulder specifically to rule out a labral tear. Phone follow-up to delineate further treatment after I review those results. In the meantime, she will continue with her home exercises as long as they are not too painful.

## 2016-08-03 ENCOUNTER — Ambulatory Visit: Payer: 59

## 2016-08-03 DIAGNOSIS — G8929 Other chronic pain: Secondary | ICD-10-CM | POA: Diagnosis not present

## 2016-08-03 DIAGNOSIS — M25611 Stiffness of right shoulder, not elsewhere classified: Secondary | ICD-10-CM | POA: Diagnosis not present

## 2016-08-03 DIAGNOSIS — M6281 Muscle weakness (generalized): Secondary | ICD-10-CM | POA: Diagnosis not present

## 2016-08-03 DIAGNOSIS — R110 Nausea: Secondary | ICD-10-CM | POA: Diagnosis not present

## 2016-08-03 DIAGNOSIS — M25511 Pain in right shoulder: Principal | ICD-10-CM

## 2016-08-03 NOTE — Therapy (Signed)
[facility] [address], [place], [place], [address] Phone: [phone]   Fax:  [phone]  Physical Therapy Treatment  Patient Details  Name: Stacey Jordan MRN: [id number] Date of Birth: 1971-03-11 Referring Provider: Yvetta Coder, MD  Encounter Date: 08/03/2016      PT End of Session - 08/03/16 1140    Visit Number 2   Date for PT Re-Evaluation 09/18/16   PT Start Time 1107   PT Stop Time 1145   PT Time Calculation (min) 38 min   Activity Tolerance Patient tolerated treatment well   Behavior During Therapy [facility] for tasks assessed/performed      Past Medical History:  Diagnosis Date  . Complication of anesthesia    very sensitive to aneth and pain meds  . DDD (degenerative disc disease), lumbar   . Medical history non-contributory   . PONV (postoperative nausea and vomiting)     Past Surgical History:  Procedure Laterality Date  . BREAST SURGERY  2005   Benign Rt.breast Bx  . HEMILAMINOTOMY LUMBAR SPINE Left 06/03/2008   L5-S1; with microdiscectomy and medial facetectomy  . MASS EXCISION Right 04/03/2013   Procedure:  MASS EXCISION AND BIOPSY;  Surgeon: Linna Hoff, MD;  Location: [place];  Service: Orthopedics;  Laterality: Right;  . RADIOACTIVE SEED GUIDED EXCISIONAL BREAST BIOPSY Left 02/24/2016   Procedure: RADIOACTIVE SEED GUIDED EXCISIONAL LEFT BREAST BIOPSY;  Surgeon: Rolm Bookbinder, MD;  Location: [place];  Service: General;  Laterality: Left;  RADIOACTIVE SEED GUIDED EXCISIONAL LEFT BREAST BIOPSY  . WISDOM TOOTH EXTRACTION      There were no vitals filed for this visit.      Subjective Assessment - 08/03/16 1110    Subjective Pt reports that Rt shoulder is feeling better.  MD ordered MRI.  Pt will schedule.     Diagnostic tests Ultrasound: subacromial bursitis and supraspinatus tendinopathy   Currently in Pain? Yes   Pain Score 3    Pain Location  Shoulder   Pain Orientation Right   Pain Descriptors / Indicators Aching;Burning;Stabbing   Pain Type Acute pain   Pain Onset More than a month ago   Pain Frequency Constant   Aggravating Factors  sleep at night, movement into ER and abduction, midrange motion   Pain Relieving Factors ibuprofen, not using it                         [facility] Adult PT Treatment/Exercise - 08/03/16 0001      Exercises   Exercises Shoulder     Shoulder Exercises: Supine   External Rotation Strengthening;Both;20 reps;Theraband   Theraband Level (Shoulder External Rotation) Level 1 (Yellow)   Other Supine Exercises horizontal abduction yellow x10     Shoulder Exercises: Prone   Other Prone Exercises quadruped: cat/camel x 10     Shoulder Exercises: Standing   Extension Strengthening;Left;20 reps;Theraband   Theraband Level (Shoulder Extension) Level 2 (Red)   Row Strengthening;Both;Theraband;20 reps   Theraband Level (Shoulder Row) Level 2 (Red)   Other Standing Exercises snow angel 2x10                PT Education - 08/03/16 1132    Education provided Yes   Education Details snow angels, cow/camel, bil shoulder extension and rows   Person(s) Educated Patient   Methods Explanation;Demonstration;Handout   Comprehension Verbalized understanding          PT  Short Term Goals - 08/03/16 1112      PT SHORT TERM GOAL #1   Title be independent in initial HEP   Time 4   Period Weeks   Status On-going     PT SHORT TERM GOAL #2   Title report 75% use of Rt UE with home and work tasks   Time 4   Period Weeks   Status On-going     PT SHORT TERM GOAL #3   Title sleep with 50% fewer interruptions due to Rt shoulder pain   Time 4   Period Weeks   Status On-going           PT Long Term Goals - 07/24/16 0902      PT LONG TERM GOAL #1   Title be independent in advanced HEP   Time 8   Period Weeks   Status New     PT LONG TERM GOAL #2   Title reduce FOTO to <  or = to 31% limitation   Time 8   Period Weeks   Status New     PT LONG TERM GOAL #3   Title report > or = to 90% use of Rt UE with home and work tasks   Time 8   Period Weeks   Status New     PT LONG TERM GOAL #4   Title report a 75% reduction in sleep interruptions due to Rt shoulder pain   Time 8   Period Weeks   Status New     PT LONG TERM GOAL #5   Title demonstrate Rt shoulder IR to lacking < or = to 3 inches to improve self-care   Time 8   Period Weeks   Status New     Additional Long Term Goals   Additional Long Term Goals Yes     PT LONG TERM GOAL #6   Title report < or = to 3/10 Rt shoulder pain with use   Time 8   Period Weeks   Status New               Plan - 08/03/16 1136    Clinical Impression Statement Pt reports that sleep is improved and she is only waking 1x/night.  Pain is reduced with movement.  Pt with decreased scapular strength and has improved control with exercise today.  Pt will have MRI soon.  Pt will continue to benefit from skilled PT for scapular strength, endurance and modalities as needed.     Rehab Potential Good   PT Frequency 2x / week   PT Duration 8 weeks   PT Treatment/Interventions ADLs/Self Care Home Management;Cryotherapy;Electrical Stimulation;Iontophoresis 4mg /ml Dexamethasone;Functional mobility training;Ultrasound;Moist Heat;Therapeutic activities;Therapeutic exercise;Patient/family education;Passive range of motion;Manual techniques;Dry needling;Taping   PT Next Visit Plan Scapular stabilization-review HEP, isometrics for rotator cuff, shoulder stability, A/AROM, pain management   Consulted and Agree with Plan of Care Patient      Patient will benefit from skilled therapeutic intervention in order to improve the following deficits and impairments:  Pain, Decreased strength, Impaired flexibility, Decreased activity tolerance, Impaired UE functional use, Decreased range of motion  Visit Diagnosis: Chronic right  shoulder pain  Muscle weakness (generalized)  Stiffness of right shoulder, not elsewhere classified     Problem List Patient Active Problem List   Diagnosis Date Noted  . Right shoulder pain 07/07/2016  . Breast mass, right 02/14/2016  . Elbow pain, chronic 05/20/2013  . Lateral epicondylitis of right elbow 05/20/2013  Sigurd Sos, PT 08/03/16 11:46 AM  [facility] [address], [place], [place], [address] Phone: [phone]   Fax:  [phone]  Name: Stacey Jordan MRN: [id number] Date of Birth: [birth year]

## 2016-08-03 NOTE — Patient Instructions (Addendum)
[employer]   Stand with back to wall and elbows/forearms at 90 degrees as shown. Bring hands toward each other as if doing a 'snow angel'  Angry Cat Stretch  Tuck chin and tighten stomach, arching back. Repeat __5-10__ times per set.  Do _1-2___ sessions per day.  KEEP HEAD IN NEUTRAL AND SHOULDERS DOWN AND RELAXED: both arms at the same time   Hold tubing in right hand, arm forward. Pull arm back, elbow straight. Repeat __10__ times per set. Do __2__ sets per session. Do _1-2___ sessions per day.  Copyright  V[employer]. All rights reserved.     With resistive band anchored in door, grasp both ends. Keeping elbows bent, pull back, squeezing shoulder blades together. Hold _3__ seconds. Repeat _2x10___ times. Do _1-2___ sessions per day.  [email or web address]   [facility] [address], [place], [place] [address] Phone # [phone] Fax [phone]

## 2016-08-08 ENCOUNTER — Ambulatory Visit: Payer: 59 | Attending: Sports Medicine

## 2016-08-08 DIAGNOSIS — M67911 Unspecified disorder of synovium and tendon, right shoulder: Secondary | ICD-10-CM | POA: Diagnosis not present

## 2016-08-08 DIAGNOSIS — M25611 Stiffness of right shoulder, not elsewhere classified: Secondary | ICD-10-CM | POA: Insufficient documentation

## 2016-08-08 DIAGNOSIS — M6281 Muscle weakness (generalized): Secondary | ICD-10-CM | POA: Diagnosis not present

## 2016-08-08 DIAGNOSIS — G8929 Other chronic pain: Secondary | ICD-10-CM | POA: Diagnosis not present

## 2016-08-08 DIAGNOSIS — M25511 Pain in right shoulder: Secondary | ICD-10-CM | POA: Diagnosis not present

## 2016-08-08 NOTE — Therapy (Addendum)
[facility] [address], [place], [place], [address] Phone: [phone]   Fax:  [phone]  Physical Therapy Treatment  Patient Details  Name: Stacey Jordan MRN: [id number] Date of Birth: Nov 08, 1970 Referring Provider: Yvetta Coder, MD  Encounter Date: 08/08/2016      PT End of Session - 08/08/16 0844    Visit Number 3   Date for PT Re-Evaluation 09/18/16   PT Start Time 0803   PT Stop Time 0844   PT Time Calculation (min) 41 min   Activity Tolerance Patient tolerated treatment well   Behavior During Therapy [facility] for tasks assessed/performed      Past Medical History:  Diagnosis Date  . Complication of anesthesia    very sensitive to aneth and pain meds  . DDD (degenerative disc disease), lumbar   . Medical history non-contributory   . PONV (postoperative nausea and vomiting)     Past Surgical History:  Procedure Laterality Date  . BREAST SURGERY  2005   Benign Rt.breast Bx  . HEMILAMINOTOMY LUMBAR SPINE Left 06/03/2008   L5-S1; with microdiscectomy and medial facetectomy  . MASS EXCISION Right 04/03/2013   Procedure:  MASS EXCISION AND BIOPSY;  Surgeon: Linna Hoff, MD;  Location: [place];  Service: Orthopedics;  Laterality: Right;  . RADIOACTIVE SEED GUIDED EXCISIONAL BREAST BIOPSY Left 02/24/2016   Procedure: RADIOACTIVE SEED GUIDED EXCISIONAL LEFT BREAST BIOPSY;  Surgeon: Rolm Bookbinder, MD;  Location: [place];  Service: General;  Laterality: Left;  RADIOACTIVE SEED GUIDED EXCISIONAL LEFT BREAST BIOPSY  . WISDOM TOOTH EXTRACTION      There were no vitals filed for this visit.      Subjective Assessment - 08/08/16 0808    Subjective Rt shoulder is feeling a little more aggravated over the past few days.     Patient Stated Goals reduce pain at night, use Rt arm without pain.  Pt is currently using Rt UE 50% of normal   Currently in Pain? Yes   Pain Score 5     Pain Location Shoulder   Pain Orientation Right   Pain Descriptors / Indicators Aching;Burning;Stabbing   Pain Type Acute pain   Pain Onset More than a month ago   Pain Frequency Intermittent   Aggravating Factors  sleep at night, movement into ER and abduction, mid range motion   Pain Relieving Factors ibuprofen, not using it                         [facility] Adult PT Treatment/Exercise - 08/08/16 0001      Shoulder Exercises: Supine   Other Supine Exercises horizontal abduction red on foam roll 2x10   Other Supine Exercises supine on foam roll: pec stretch, then overhead flexion bil. 2x10     Shoulder Exercises: Prone   Other Prone Exercises quadruped: cat/camel x 10     Shoulder Exercises: Standing   Extension Strengthening;Left;20 reps;Theraband   Theraband Level (Shoulder Extension) Level 2 (Red)   Row Strengthening;Both;Theraband;20 reps   Theraband Level (Shoulder Row) Level 2 (Red)   Other Standing Exercises snow angel 2x10                  PT Short Term Goals - 08/08/16 0810      PT SHORT TERM GOAL #1   Title be independent in initial HEP   Status Achieved     PT SHORT TERM GOAL #2  Title report 75% use of Rt UE with home and work tasks   Time 4   Period Weeks   Status On-going     PT SHORT TERM GOAL #3   Title sleep with 50% fewer interruptions due to Rt shoulder pain   Time 4   Period Weeks   Status On-going           PT Long Term Goals - 07/24/16 0902      PT LONG TERM GOAL #1   Title be independent in advanced HEP   Time 8   Period Weeks   Status New     PT LONG TERM GOAL #2   Title reduce FOTO to < or = to 31% limitation   Time 8   Period Weeks   Status New     PT LONG TERM GOAL #3   Title report > or = to 90% use of Rt UE with home and work tasks   Time 8   Period Weeks   Status New     PT LONG TERM GOAL #4   Title report a 75% reduction in sleep interruptions due to Rt shoulder pain   Time 8   Period  Weeks   Status New     PT LONG TERM GOAL #5   Title demonstrate Rt shoulder IR to lacking < or = to 3 inches to improve self-care   Time 8   Period Weeks   Status New     Additional Long Term Goals   Additional Long Term Goals Yes     PT LONG TERM GOAL #6   Title report < or = to 3/10 Rt shoulder pain with use   Time 8   Period Weeks   Status New               Plan - 08/08/16 0810    Clinical Impression Statement Pt reports that sleep is still disrupted and she is walking 1-3x/night.  Pt also describes 5/10 Rt shoulder pain with midrange use.  Pt is moderately compliant with HEP.  Pt would like to do exercises at home and reduce frequency of PT in the clinic.  Pt will schedule MRI soon.  Pt remains weak in her scapular stabilizers.  Pt will continue to benefit from skilled PT for scapular strength, endurance, and modalities as needed.     Rehab Potential Good   PT Frequency 2x / week   PT Duration 8 weeks   PT Treatment/Interventions ADLs/Self Care Home Management;Cryotherapy;Electrical Stimulation;Iontophoresis '4mg'$ /ml Dexamethasone;Functional mobility training;Ultrasound;Moist Heat;Therapeutic activities;Therapeutic exercise;Patient/family education;Passive range of motion;Manual techniques;Dry needling;Taping   PT Next Visit Plan Scapular stabilization-review HEP, isometrics for rotator cuff, shoulder stability, A/AROM, pain management   Consulted and Agree with Plan of Care Patient      Patient will benefit from skilled therapeutic intervention in order to improve the following deficits and impairments:  Pain, Decreased strength, Impaired flexibility, Decreased activity tolerance, Impaired UE functional use, Decreased range of motion  Visit Diagnosis: Chronic right shoulder pain  Muscle weakness (generalized)  Stiffness of right shoulder, not elsewhere classified     Problem List Patient Active Problem List   Diagnosis Date Noted  . Right shoulder pain  07/07/2016  . Breast mass, right 02/14/2016  . Elbow pain, chronic 05/20/2013  . Lateral epicondylitis of right elbow 05/20/2013     Sigurd Sos, PT 08/08/16 8:58 AM PHYSICAL THERAPY DISCHARGE SUMMARY  Visits from Start of Care: 3  Current functional level related  to goals / functional outcomes: Pt attended 3 PT sessions and didn't return pending MRI.  Pt will continue to perform HEP and activity modification based on results of MRI.     Remaining deficits: See above for most current status.     Education / Equipment: HEP, posture/body mechanics, activity modification Plan: Patient agrees to discharge.  Patient goals were not met. Patient is being discharged due to the patient's request.  ?????        Sigurd Sos, PT 09/19/16 9:05 AM   [facility] [address] [place], [place], [address] Phone: [phone]   Fax:  [phone]  Name: Stacey Jordan MRN: [id number] Date of Birth: November 16, 1970

## 2016-08-17 ENCOUNTER — Ambulatory Visit (HOSPITAL_COMMUNITY)
Admission: RE | Admit: 2016-08-17 | Discharge: 2016-08-17 | Disposition: A | Discharge status: Home / Self Care | Payer: 59 | Source: Ambulatory Visit | Attending: Sports Medicine | Admitting: Sports Medicine

## 2016-08-17 ENCOUNTER — Encounter (HOSPITAL_COMMUNITY): Payer: Self-pay

## 2016-08-17 DIAGNOSIS — M25511 Pain in right shoulder: Secondary | ICD-10-CM | POA: Diagnosis not present

## 2016-08-17 MED ORDER — IOPAMIDOL (ISOVUE-300) INJECTION 61%
INTRAVENOUS | Status: AC
  Administered 2016-08-17: 10 mL
  Filled 2016-08-17: qty 50

## 2016-08-17 MED ORDER — GADOBENATE DIMEGLUMINE 529 MG/ML IV SOLN
5.0000 mL | Freq: Once | INTRAVENOUS | Status: AC | PRN
  Administered 2016-08-17: 5 mL via INTRAVENOUS

## 2016-08-17 MED ORDER — LIDOCAINE HCL 1 % IJ SOLN
INTRAMUSCULAR | Status: AC
  Administered 2016-08-17: 5 mL
  Filled 2016-08-17: qty 20

## 2016-08-17 MED ORDER — IOPAMIDOL (ISOVUE-300) INJECTION 61%: 50.0000 mL | Freq: Once | INTRAVENOUS | Status: DC | PRN

## 2016-08-22 ENCOUNTER — Ambulatory Visit: Payer: 59

## 2016-08-24 ENCOUNTER — Other Ambulatory Visit: Payer: Self-pay | Admitting: *Deleted

## 2016-08-24 DIAGNOSIS — M25511 Pain in right shoulder: Secondary | ICD-10-CM

## 2016-08-25 DIAGNOSIS — H524 Presbyopia: Secondary | ICD-10-CM | POA: Diagnosis not present

## 2016-08-31 ENCOUNTER — Ambulatory Visit (HOSPITAL_COMMUNITY)
Admission: RE | Admit: 2016-08-31 | Discharge: 2016-08-31 | Disposition: A | Discharge status: Home / Self Care | Payer: 59 | Source: Ambulatory Visit | Attending: Sports Medicine | Admitting: Sports Medicine

## 2016-08-31 DIAGNOSIS — M7581 Other shoulder lesions, right shoulder: Secondary | ICD-10-CM | POA: Insufficient documentation

## 2016-08-31 DIAGNOSIS — M25511 Pain in right shoulder: Secondary | ICD-10-CM

## 2016-08-31 MED ORDER — LIDOCAINE HCL 2 % EX GEL
CUTANEOUS | Status: AC
  Filled 2016-08-31: qty 30

## 2016-08-31 MED ORDER — STERILE WATER FOR INJECTION IJ SOLN
INTRAMUSCULAR | Status: AC
  Filled 2016-08-31: qty 10

## 2016-08-31 MED ORDER — LIDOCAINE HCL 1 % IJ SOLN
INTRAMUSCULAR | Status: AC
  Filled 2016-08-31: qty 20

## 2016-08-31 MED ORDER — IOPAMIDOL (ISOVUE-300) INJECTION 61%
INTRAVENOUS | Status: AC
  Administered 2016-08-31: 10 mL via INTRA_ARTICULAR
  Filled 2016-08-31: qty 50

## 2016-09-06 ENCOUNTER — Telehealth: Payer: Self-pay | Admitting: Sports Medicine

## 2016-09-06 NOTE — Telephone Encounter (Signed)
  I spoke with Ivin Booty on the phone today after reviewing the MRI arthrogram of her right shoulder. She has atypical severe teres minor tendinosis with some accompanying bursitis. No full-thickness rotator cuff tear. No labral tear. Based on these findings, I recommend that she return to the office for [place] to discuss proper rehabilitation exercises for this issue. I will also plan on reviewing her MRI and discussing her pathology in greater detail using a shoulder model. She will schedule this follow-up visit at her convenience.

## 2016-09-11 ENCOUNTER — Ambulatory Visit: Payer: 59 | Admitting: Sports Medicine

## 2016-09-12 ENCOUNTER — Ambulatory Visit (INDEPENDENT_AMBULATORY_CARE_PROVIDER_SITE_OTHER): Payer: 59 | Admitting: Sports Medicine

## 2016-09-12 ENCOUNTER — Encounter: Payer: Self-pay | Admitting: Sports Medicine

## 2016-09-12 VITALS — BP 110/60 | Ht 64.0 in | Wt 116.0 lb

## 2016-09-12 DIAGNOSIS — M67911 Unspecified disorder of synovium and tendon, right shoulder: Secondary | ICD-10-CM

## 2016-09-12 NOTE — Progress Notes (Signed)
   Subjective:    Patient ID: Stacey Jordan, female    DOB: May 01, 1971, 46 y.o.   MRN: [id number]  HPI   Patient comes in today at my request to discuss MRI findings of her right shoulder. MRI arthrogram shows no evidence of labral pathology. She does have atypical severe teres minor tendinosis with adjacent subdeltoid bursal fluid and reactive edema posteriorly in the humeral head. The remainder of the rotator cuff tendons are unremarkable. She continues to localize most of her pain to the anterior shoulder. It is worse with overhead activity as well as with movements such as throwing a ball. Her symptoms have been present for several months and are overall improving but certainly still present and affecting her level of activity. The nighttime pain she was experiencing previously has resolved. She has had some limited physical therapy.    Review of Systems    as above Objective:   Physical Exam  Well-developed, fit appearing. No acute distress. Awake alert and oriented 3.  Right shoulder: Full range of motion. She does have pain with empty can testing but her supraspinatus strength is 5/5 and only mildly painful. She has reproducible pain and 4+/5 strength with resisted external rotation. 5/5 strength with resisted internal rotation. Neurovascularly intact distally.  MRI arthrogram findings of the right shoulder are as above      Assessment & Plan:   Right shoulder pain secondary to severe teres minor tendinopathy  This is an unusual injury. I explained to the patient that the majority of rotator cuff injuries affect the other tendons and that an isolated teres minor injury is unusual. Nonetheless, I think we should try to continue with conservative treatment. I've given the patient a series of eccentric and isometric exercises to start doing. I would like to see her in follow-up in 4-6 weeks. As long as she continues to improve, we will continue on this conservative track. However,  the patient does understand that a surgical consultation would be reasonable if her symptoms do not continue to improve. She is encouraged to call with questions or concerns prior to her follow-up visit.

## 2016-10-24 ENCOUNTER — Ambulatory Visit: Payer: 59 | Admitting: Sports Medicine

## 2017-01-26 ENCOUNTER — Encounter: Payer: Self-pay | Admitting: Obstetrics and Gynecology

## 2017-01-26 ENCOUNTER — Ambulatory Visit (INDEPENDENT_AMBULATORY_CARE_PROVIDER_SITE_OTHER): Payer: 59 | Admitting: Obstetrics and Gynecology

## 2017-01-26 VITALS — BP 90/58 | HR 64 | Resp 12 | Ht 63.25 in | Wt 118.8 lb

## 2017-01-26 DIAGNOSIS — Z3009 Encounter for other general counseling and advice on contraception: Secondary | ICD-10-CM | POA: Diagnosis not present

## 2017-01-26 DIAGNOSIS — N644 Mastodynia: Secondary | ICD-10-CM | POA: Diagnosis not present

## 2017-01-26 DIAGNOSIS — Z01419 Encounter for gynecological examination (general) (routine) without abnormal findings: Secondary | ICD-10-CM

## 2017-01-26 DIAGNOSIS — R002 Palpitations: Secondary | ICD-10-CM | POA: Diagnosis not present

## 2017-01-26 LAB — CBC
HCT: 41.1 % (ref 35.0–45.0)
Hemoglobin: 13.3 g/dL (ref 11.7–15.5)
MCH: 29.6 pg (ref 27.0–33.0)
MCHC: 32.4 g/dL (ref 32.0–36.0)
MCV: 91.5 fL (ref 80.0–100.0)
MPV: 9.7 fL (ref 7.5–12.5)
PLATELETS: 227 10*3/uL (ref 140–400)
RBC: 4.49 MIL/uL (ref 3.80–5.10)
RDW: 13.1 % (ref 11.0–15.0)
WBC: 5.5 10*3/uL (ref 3.8–10.8)

## 2017-01-26 NOTE — Patient Instructions (Signed)

## 2017-01-26 NOTE — Progress Notes (Signed)
46 y.o. G47P2002 Married Caucasian female here for annual exam.    Had a left breast lumpectomy which was benign.  Feels like she has left rib soreness following the procedure. Notes some left axillary soreness now.  Has mid morning weakness and feels the need for a snack.  No hx GDM.   Also has a fluttering sensation and almost like a "pause." Is not sure what it is.  She feels fearful when it occurs.   States she had a vagal type response with the last IUD placement.   Has stress incontinence. Did not do PT.   Soccer tournament this weekend.   PCP:  Carmelina Dane, MD - [facility]  No LMP recorded. Patient is not currently having periods (Reason: IUD).        Mirena IUD place on 05/08/12 [facility].    Sexually active: Yes.    The current method of family planning is IUD -- Mirena.    Exercising: No.  occasionally does walking, bike riding, swimming, and aerobics Smoker:  no  Health Maintenance: Pap:  01/20/16 Pap and HR HPV negative History of abnormal Pap:  no MMG:  01/25/16 w/ Left Breast US -- BIRADS 4 Suspicious  02/03/16 Left Diagnostic MMG -- Appropriate positioning of the coil shaped biopsy marking clip at the site of the biopsied distorted mass at 330  02/23/16 MM LT Biopsy -- Negative Colonoscopy:  n/a BMD:   n/a  Result  n/a TDaP:  01/20/16 Gardasil:   n/a HIV: done with pregnancy, negative Screening Labs:  Hb today: discuss today   reports that she has never smoked. She has never used smokeless tobacco. She reports that she drinks about 3.0 oz of alcohol per week . She reports that she does not use drugs.  Past Medical History:  Diagnosis Date  . Complication of anesthesia    very sensitive to aneth and pain meds  . DDD (degenerative disc disease), lumbar   . Medical history non-contributory   . PONV (postoperative nausea and vomiting)     Past Surgical History:  Procedure Laterality Date  . BREAST SURGERY  2005   Benign Rt.breast Bx  .  HEMILAMINOTOMY LUMBAR SPINE Left 06/03/2008   L5-S1; with microdiscectomy and medial facetectomy  . MASS EXCISION Right 04/03/2013   Procedure:  MASS EXCISION AND BIOPSY;  Surgeon: Linna Hoff, MD;  Location: [place];  Service: Orthopedics;  Laterality: Right;  . RADIOACTIVE SEED GUIDED EXCISIONAL BREAST BIOPSY Left 02/24/2016   Procedure: RADIOACTIVE SEED GUIDED EXCISIONAL LEFT BREAST BIOPSY;  Surgeon: Rolm Bookbinder, MD;  Location: [place];  Service: General;  Laterality: Left;  RADIOACTIVE SEED GUIDED EXCISIONAL LEFT BREAST BIOPSY  . WISDOM TOOTH EXTRACTION      Current Outpatient Prescriptions  Medication Sig Dispense Refill  . Cholecalciferol (VITAMIN D PO) Take by mouth daily.    . Omega-3 Fatty Acids (FISH OIL PO) Take by mouth daily.    . TURMERIC PO Take by mouth daily.    . vitamin B-12 (CYANOCOBALAMIN) 1000 MCG tablet Take 1,000 mcg by mouth daily.     No current facility-administered medications for this visit.     Family History  Problem Relation Age of Onset  . CAD Father   . Hypertension Father   . Hyperlipidemia Father   . Stroke Paternal Grandmother     ROS:  Pertinent items are noted in HPI.  Otherwise, a comprehensive ROS was negative.  Exam:   BP Marland Kitchen)  90/58 (BP Location: Right Arm, Patient Position: Sitting, Cuff Size: Normal)   Pulse 64   Resp 12   Ht 5' 3.25" (1.607 m)   Wt 118 lb 12.8 oz (53.9 kg)   BMI 20.88 kg/m     General appearance: alert, cooperative and appears stated age Head: Normocephalic, without obvious abnormality, atraumatic Neck: no adenopathy, supple, symmetrical, trachea midline and thyroid normal to inspection and palpation Lungs: clear to auscultation bilaterally Breasts: normal appearance, no masses or tenderness, No nipple retraction or dimpling, No nipple discharge or bleeding, No axillary or supraclavicular adenopathy Heart: regular rate and rhythm Abdomen: soft, non-tender; no masses, no  organomegaly Extremities: extremities normal, atraumatic, no cyanosis or edema Skin: Skin color, texture, turgor normal. No rashes or lesions Lymph nodes: Cervical, supraclavicular, and axillary nodes normal. No abnormal inguinal nodes palpated Neurologic: Grossly normal  Pelvic: External genitalia:  no lesions              Urethra:  normal appearing urethra with no masses, tenderness or lesions              Bartholins and Skenes: normal                 Vagina: normal appearing vagina with normal color and discharge, no lesions              Cervix: no lesions.  IUD strings noted.              Pap taken: No. Bimanual Exam:  Uterus:  normal size, contour, position, consistency, mobility, non-tender              Adnexa: no mass, fullness, tenderness              Rectal exam: Yes.  .  Confirms.              Anus:  normal sphincter tone, no lesions  Chaperone was present for exam.  Assessment:   Well woman visit with normal exam. Mirena IUD.  Status post left breast biopsy. Left breast pain.  Weakness. Palpitations.  GSI.   Plan: Mammogram screening discussed.  Will schedule bilateral dx mammogram and left breast ultrasound. Recommended self breast awareness. Pap and HR HPV as above. Guidelines for Calcium, Vitamin D, regular exercise program including cardiovascular and weight bearing exercise. Routine labs including full TFTs and HgbA1C.  Referral to cardiology.  Discussed midurethral sling for stress incontinence versus pelvic floor PT.  ACOG handouts. Follow up annually and prn.       After visit summary provided.

## 2017-01-26 NOTE — Progress Notes (Signed)
Patient scheduled while in office. Spoke with Yetta Flock at [facility]. Patient is scheduled for bilateral diagnostic MMG and left breast US on 01/31/17 at 8:20am. Patient is agreeable to date and time.  Spoke with Clinical cytogeneticist at [facility]. Patient scheduled with Dr. Harrington Challenger on 03/16/17 at 10:20am, first available appointment. Patient is agreeable to date and time.  Patient scheduled for Mirena IUD removal and reinsertion on 05/03/17 at 3pm with Dr. Quincy Simmonds. Patient is agreeable to date and time.

## 2017-01-27 LAB — COMPREHENSIVE METABOLIC PANEL
ALK PHOS: 30 U/L — AB (ref 33–115)
ALT: 10 U/L (ref 6–29)
AST: 17 U/L (ref 10–35)
Albumin: 4.2 g/dL (ref 3.6–5.1)
BUN: 10 mg/dL (ref 7–25)
CO2: 22 mmol/L (ref 20–31)
Calcium: 9 mg/dL (ref 8.6–10.2)
Chloride: 103 mmol/L (ref 98–110)
Creat: 0.8 mg/dL (ref 0.50–1.10)
Glucose, Bld: 47 mg/dL — ABNORMAL LOW (ref 65–99)
POTASSIUM: 4.3 mmol/L (ref 3.5–5.3)
Sodium: 137 mmol/L (ref 135–146)
Total Bilirubin: 0.9 mg/dL (ref 0.2–1.2)
Total Protein: 6.6 g/dL (ref 6.1–8.1)

## 2017-01-27 LAB — VITAMIN D 25 HYDROXY (VIT D DEFICIENCY, FRACTURES): VIT D 25 HYDROXY: 30 ng/mL (ref 30–100)

## 2017-01-27 LAB — LIPID PANEL
CHOLESTEROL: 186 mg/dL (ref <200)
HDL: 80 mg/dL (ref >50)
LDL Cholesterol: 93 mg/dL (ref <100)
Total CHOL/HDL Ratio: 2.3 Ratio (ref <5.0)
Triglycerides: 65 mg/dL (ref <150)
VLDL: 13 mg/dL (ref <30)

## 2017-01-27 LAB — THYROID PANEL WITH TSH
FREE THYROXINE INDEX: 2.2 (ref 1.4–3.8)
T3 UPTAKE: 31 % (ref 22–35)
T4 TOTAL: 7 ug/dL (ref 4.5–12.0)
TSH: 1.72 mIU/L

## 2017-01-27 LAB — HEMOGLOBIN A1C
Hgb A1c MFr Bld: 4.5 % (ref <5.7)
Mean Plasma Glucose: 82 mg/dL

## 2017-01-31 ENCOUNTER — Ambulatory Visit
Admission: RE | Admit: 2017-01-31 | Discharge: 2017-01-31 | Disposition: A | Discharge status: Home / Self Care | Payer: 59 | Source: Ambulatory Visit | Attending: Obstetrics and Gynecology | Admitting: Obstetrics and Gynecology

## 2017-01-31 DIAGNOSIS — R928 Other abnormal and inconclusive findings on diagnostic imaging of breast: Secondary | ICD-10-CM | POA: Diagnosis not present

## 2017-01-31 DIAGNOSIS — N644 Mastodynia: Secondary | ICD-10-CM

## 2017-01-31 DIAGNOSIS — N6489 Other specified disorders of breast: Secondary | ICD-10-CM | POA: Diagnosis not present

## 2017-02-26 ENCOUNTER — Encounter: Payer: Self-pay | Admitting: Internal Medicine

## 2017-03-14 ENCOUNTER — Encounter: Payer: Self-pay | Admitting: Internal Medicine

## 2017-03-14 ENCOUNTER — Ambulatory Visit (INDEPENDENT_AMBULATORY_CARE_PROVIDER_SITE_OTHER): Payer: 59 | Admitting: Internal Medicine

## 2017-03-14 ENCOUNTER — Encounter (INDEPENDENT_AMBULATORY_CARE_PROVIDER_SITE_OTHER): Payer: Self-pay

## 2017-03-14 VITALS — BP 114/68 | HR 75 | Ht 63.25 in | Wt 120.2 lb

## 2017-03-14 DIAGNOSIS — R002 Palpitations: Secondary | ICD-10-CM

## 2017-03-14 MED ORDER — METOPROLOL TARTRATE 25 MG PO TABS: 12.5000 mg | ORAL_TABLET | Freq: Two times a day (BID) | ORAL | 6 refills | Status: DC

## 2017-03-14 MED FILL — METOPROLOL TARTRATE 25 MG T: 25 | 30 days supply | Qty: 30 | Fill #0

## 2017-03-14 NOTE — Patient Instructions (Signed)
Your physician has recommended you make the following change in your medication:  1.) metoprolol tartrate 25 mg --take 12.5 mg twice a day for palpitations  Your physician has recommended that you wear an event monitor. Event monitors are medical devices that record the heart's electrical activity. Doctors most often [place] these monitors to diagnose arrhythmias. Arrhythmias are problems with the speed or rhythm of the heartbeat. The monitor is a small, portable device. You can wear one while you do your normal daily activities. This is usually used to diagnose what is causing palpitations/syncope (passing out).  Your physician recommends that you schedule a follow-up appointment in: December, 2018 with Dr. Harrington Challenger.

## 2017-03-14 NOTE — Progress Notes (Signed)
Cardiology Office Note   Date:  03/14/2017   ID:  RAMANDEEP ARINGTON, DOB [birth year], MRN [id number]  PCP:  Kelton Pillar, MD  Cardiologist:   Dorris Carnes, MD    PT referred by Dr Quincy Simmonds for palpitations     History of Present Illness: Stacey Jordan is a 46 y.o. female with a history of  Palpitatiosns   Now when they happen feaflu    Since last 6 wks had one Random  May have 3 in a week  Then none for weeks   No partigular time of day   When gets them feels butterfly in throat  Last 2 to 5 seconds  SOmetimes feels a little weak    Father 57  Fist MI at 66     Current Meds  Medication Sig  . Cholecalciferol (VITAMIN D PO) Take by mouth daily.  . Omega-3 Fatty Acids (FISH OIL PO) Take by mouth daily.  . TURMERIC PO Take by mouth daily.  . vitamin B-12 (CYANOCOBALAMIN) 1000 MCG tablet Take 1,000 mcg by mouth daily.     Allergies:   Patient has no known allergies.   Past Medical History:  Diagnosis Date  . Complication of anesthesia    very sensitive to aneth and pain meds  . DDD (degenerative disc disease), lumbar   . Medical history non-contributory   . PONV (postoperative nausea and vomiting)     Past Surgical History:  Procedure Laterality Date  . BREAST BIOPSY Left 02/03/2016   U/S- Benign  . BREAST BIOPSY  04/24/2007   U/S Core- Benign  . BREAST EXCISIONAL BIOPSY Left 02/23/2016  . BREAST SURGERY  2005   Benign Rt.breast Bx  . HEMILAMINOTOMY LUMBAR SPINE Left 06/03/2008   L5-S1; with microdiscectomy and medial facetectomy  . MASS EXCISION Right 04/03/2013   Procedure:  MASS EXCISION AND BIOPSY;  Surgeon: Linna Hoff, MD;  Location: [place];  Service: Orthopedics;  Laterality: Right;  . RADIOACTIVE SEED GUIDED EXCISIONAL BREAST BIOPSY Left 02/24/2016   Procedure: RADIOACTIVE SEED GUIDED EXCISIONAL LEFT BREAST BIOPSY;  Surgeon: Rolm Bookbinder, MD;  Location: [place];  Service: General;  Laterality: Left;  RADIOACTIVE  SEED GUIDED EXCISIONAL LEFT BREAST BIOPSY  . WISDOM TOOTH EXTRACTION       Social History:  The patient  reports that she has never smoked. She has never used smokeless tobacco. She reports that she drinks about 3.0 oz of alcohol per week . She reports that she does not use drugs.   Family History:  The patient's family history includes CAD in her father; Hyperlipidemia in her father; Hypertension in her father; Stroke in her paternal grandmother.    ROS:  Please see the history of present illness. All other systems are reviewed and  Negative to the above problem except as noted.    PHYSICAL EXAM: VS:  BP 114/68   Pulse 75   Ht 5' 3.25" (1.607 m)   Wt 54.5 kg (120 lb 3.2 oz)   BMI 21.12 kg/m   GEN: Well nourished, well developed, in no acute distress  HEENT: normal  Neck: no JVD, carotid bruits, or masses Cardiac: RRR; no murmurs, rubs, or gallops,no edema  Respiratory:  clear to auscultation bilaterally, normal work of breathing GI: soft, nontender, nondistended, + BS  No hepatomegaly  MS: no deformity Moving all extremities   Skin: warm and dry, no rash Neuro:  Strength and sensation are intact Psych: euthymic mood, full affect  EKG:  EKG is ordered today.  SR 75 bpm     Lipid Panel    Component Value Date/Time   CHOL 186 01/26/2017 1325   TRIG 65 01/26/2017 1325   HDL 80 01/26/2017 1325   CHOLHDL 2.3 01/26/2017 1325   VLDL 13 01/26/2017 1325   LDLCALC 93 01/26/2017 1325      Wt Readings from Last 3 Encounters:  03/14/17 54.5 kg (120 lb 3.2 oz)  01/26/17 53.9 kg (118 lb 12.8 oz)  09/12/16 52.6 kg (116 lb)      ASSESSMENT AND PLAN:  1  Palpitations  Sound like isolated PVCs  Poss PAC s  Exam normal  EKG normal I reassured pt  I do not think they are a marker for worsening arrhythmias REcomm:  Stay hydrated  Stay active Considder event monior  Unfort with infrequ nature they will be hard to catch Discussed [employer] app for phone WIll Rx metoprolol 25  (12.5 mg prn for palpitations) F/U in December  She can also use my chart   Current medicines are reviewed at length with the patient today.  The patient does not have concerns regarding medicines.  Signed, Dorris Carnes, MD  03/14/2017 9:21 AM    [address] [place], [place], [place]  [address] Phone: [phone]; Fax: [phone]

## 2017-03-16 ENCOUNTER — Ambulatory Visit: Payer: 59 | Admitting: Internal Medicine

## 2017-05-03 ENCOUNTER — Encounter: Payer: Self-pay | Admitting: Obstetrics and Gynecology

## 2017-05-03 ENCOUNTER — Ambulatory Visit (INDEPENDENT_AMBULATORY_CARE_PROVIDER_SITE_OTHER): Payer: 59 | Admitting: Obstetrics and Gynecology

## 2017-05-03 ENCOUNTER — Ambulatory Visit (INDEPENDENT_AMBULATORY_CARE_PROVIDER_SITE_OTHER): Payer: 59

## 2017-05-03 VITALS — BP 110/62 | HR 80 | Ht 63.25 in | Wt 119.8 lb

## 2017-05-03 DIAGNOSIS — Z30433 Encounter for removal and reinsertion of intrauterine contraceptive device: Secondary | ICD-10-CM

## 2017-05-03 DIAGNOSIS — Z3009 Encounter for other general counseling and advice on contraception: Secondary | ICD-10-CM

## 2017-05-03 DIAGNOSIS — Z01812 Encounter for preprocedural laboratory examination: Secondary | ICD-10-CM | POA: Diagnosis not present

## 2017-05-03 LAB — POCT URINE PREGNANCY: PREG TEST UR: NEGATIVE

## 2017-05-03 NOTE — Progress Notes (Signed)
Encounter reviewed by Dr. Grace Haggart Amundson C. Silva.  

## 2017-05-03 NOTE — Progress Notes (Signed)
GYNECOLOGY  VISIT   HPI: 46 y.o.   Married  Caucasian  female   G2P2002 with No LMP recorded. Patient is not currently having periods (Reason: IUD).   here for Mirena IUD removal and reinsertion.   [id number]  GYNECOLOGIC HISTORY: No LMP recorded. Patient is not currently having periods (Reason: IUD). Contraception:  Mirena IUD inserted 05-08-12 Menopausal hormone therapy:  n/a Last mammogram: See Epic Last pap smear: 01-20-16 Neg:Neg HR HPV, 11-13-11 Neg        OB History    Gravida Para Term Preterm AB Living   2 2 2     2    SAB TAB Ectopic Multiple Live Births                     Patient Active Problem List   Diagnosis Date Noted  . Right shoulder pain 07/07/2016  . Breast mass, right 02/14/2016  . Elbow pain, chronic 05/20/2013  . Lateral epicondylitis of right elbow 05/20/2013    Past Medical History:  Diagnosis Date  . Complication of anesthesia    very sensitive to aneth and pain meds  . DDD (degenerative disc disease), lumbar   . Medical history non-contributory   . PONV (postoperative nausea and vomiting)     Past Surgical History:  Procedure Laterality Date  . BREAST BIOPSY Left 02/03/2016   U/S- Benign  . BREAST BIOPSY  04/24/2007   U/S Core- Benign  . BREAST EXCISIONAL BIOPSY Left 02/23/2016  . BREAST SURGERY  2005   Benign Rt.breast Bx  . HEMILAMINOTOMY LUMBAR SPINE Left 06/03/2008   L5-S1; with microdiscectomy and medial facetectomy  . MASS EXCISION Right 04/03/2013   Procedure:  MASS EXCISION AND BIOPSY;  Surgeon: Linna Hoff, MD;  Location: [place];  Service: Orthopedics;  Laterality: Right;  . RADIOACTIVE SEED GUIDED EXCISIONAL BREAST BIOPSY Left 02/24/2016   Procedure: RADIOACTIVE SEED GUIDED EXCISIONAL LEFT BREAST BIOPSY;  Surgeon: Rolm Bookbinder, MD;  Location: [place];  Service: General;  Laterality: Left;  RADIOACTIVE SEED GUIDED EXCISIONAL LEFT BREAST BIOPSY  . WISDOM TOOTH EXTRACTION      Current  Outpatient Prescriptions  Medication Sig Dispense Refill  . Cholecalciferol (VITAMIN D PO) Take by mouth daily.    . metoprolol tartrate (LOPRESSOR) 25 MG tablet Take 0.5 tablets (12.5 mg total) by mouth 2 (two) times daily. (Patient taking differently: Take 12.5 mg by mouth as needed. ) 30 tablet 6  . Omega-3 Fatty Acids (FISH OIL PO) Take by mouth daily.    . TURMERIC PO Take by mouth daily.    . vitamin B-12 (CYANOCOBALAMIN) 1000 MCG tablet Take 1,000 mcg by mouth daily.     No current facility-administered medications for this visit.      ALLERGIES: Patient has no known allergies.  Family History  Problem Relation Age of Onset  . CAD Father   . Hypertension Father   . Hyperlipidemia Father   . Stroke Paternal Grandmother     Social History   Social History  . Marital status: Married    Spouse name: N/A  . Number of children: N/A  . Years of education: N/A   Occupational History  . Not on file.   Social History Main Topics  . Smoking status: Never Smoker  . Smokeless tobacco: Never Used  . Alcohol use 3.0 oz/week    5 Standard drinks or equivalent per week     Comment: 5 glassed wine/beer/week  . Drug use:  No  . Sexual activity: Yes    Partners: Male    Birth control/ protection: IUD     Comment: Mirena IUD inserted 2012/2013   Other Topics Concern  . Not on file   Social History Narrative  . No narrative on file    ROS:  Pertinent items are noted in HPI.  PHYSICAL EXAMINATION:    BP 110/62 (BP Location: Right Arm, Patient Position: Sitting, Cuff Size: Normal)   Pulse 80   Ht 5' 3.25" (1.607 m)   Wt 119 lb 12.8 oz (54.3 kg)   BMI 21.05 kg/m     General appearance: alert, cooperative and appears stated age   Pelvic: External genitalia:  no lesions              Urethra:  normal appearing urethra with no masses, tenderness or lesions              Bartholins and Skenes: normal                 Vagina: normal appearing vagina with normal color and  discharge, no lesions              Cervix: no lesions                Bimanual Exam:  Uterus:  normal size, contour, position, consistency, mobility, non-tender              Adnexa: no mass, fullness, tenderness          Consent for procedures - IUD removal and reinsertion of new Mirena IUD. Mirena IUD insertion - lot [id number], exp Jan 2021. Prep with Hibiclens.  Rings forceps used to remove IUD, intact, shown to patient, and discarded. Paracervical block with 10 cc 1% lidocaine - lot [id number], exp 09/2018. Tenaculum to anterior cervical.  Os finder used.  Uterus sounded to almost 8 cm.  Strings trimmed.  No complications.  Minimal EBL.  Chaperone was present for exam.  ASSESSMENT  Mirena IUD removal and reinsertion of new Mirena IUD.  PLAN  Back up protection for 1 week.  Call for heavy bleeding, increasing, pain or fever. [place] post IUD placement confirms location in uterine cavity.  Follow up in 4 weeks for IUD check.  IUD card given to patient.    An After Visit Summary was printed and given to the patient.

## 2017-05-31 ENCOUNTER — Encounter: Payer: Self-pay | Admitting: Obstetrics and Gynecology

## 2017-05-31 ENCOUNTER — Ambulatory Visit (INDEPENDENT_AMBULATORY_CARE_PROVIDER_SITE_OTHER): Payer: 59 | Admitting: Obstetrics and Gynecology

## 2017-05-31 VITALS — BP 104/68 | HR 66 | Resp 14 | Ht 63.25 in | Wt 120.8 lb

## 2017-05-31 DIAGNOSIS — Z30431 Encounter for routine checking of intrauterine contraceptive device: Secondary | ICD-10-CM | POA: Diagnosis not present

## 2017-05-31 NOTE — Progress Notes (Signed)
GYNECOLOGY  VISIT   HPI: 46 y.o.   Married  Caucasian  female   G2P2002 with No LMP recorded. Patient is not currently having periods (Reason: IUD).   here for follow up after Mirena IUD inserted.   Had [place] after insertion to confirm correct position.  Has labial lumps.  No drainage or soreness.   Some bilateral breast tenderness.  Thinks she may be midcycle, but she is not bleeding with her Mirena IUD.   ROS negative.   GYNECOLOGIC HISTORY: No LMP recorded. Patient is not currently having periods (Reason: IUD). Contraception:  Mirena IUD inserted 05-03-17 Menopausal hormone therapy:  n/a Last mammogram: See Epic Last pap smear: 01-20-16 Neg:Neg HR HPV, 11-13-11 Neg        OB History    Gravida Para Term Preterm AB Living   2 2 2     2    SAB TAB Ectopic Multiple Live Births                     Patient Active Problem List   Diagnosis Date Noted  . Right shoulder pain 07/07/2016  . Breast mass, right 02/14/2016  . Elbow pain, chronic 05/20/2013  . Lateral epicondylitis of right elbow 05/20/2013    Past Medical History:  Diagnosis Date  . Complication of anesthesia    very sensitive to aneth and pain meds  . DDD (degenerative disc disease), lumbar   . Medical history non-contributory   . PONV (postoperative nausea and vomiting)     Past Surgical History:  Procedure Laterality Date  . BREAST BIOPSY Left 02/03/2016   U/S- Benign  . BREAST BIOPSY  04/24/2007   U/S Core- Benign  . BREAST EXCISIONAL BIOPSY Left 02/23/2016  . BREAST SURGERY  2005   Benign Rt.breast Bx  . HEMILAMINOTOMY LUMBAR SPINE Left 06/03/2008   L5-S1; with microdiscectomy and medial facetectomy  . MASS EXCISION Right 04/03/2013   Procedure:  MASS EXCISION AND BIOPSY;  Surgeon: Linna Hoff, MD;  Location: [place];  Service: Orthopedics;  Laterality: Right;  . RADIOACTIVE SEED GUIDED EXCISIONAL BREAST BIOPSY Left 02/24/2016   Procedure: RADIOACTIVE SEED GUIDED EXCISIONAL LEFT  BREAST BIOPSY;  Surgeon: Rolm Bookbinder, MD;  Location: [place];  Service: General;  Laterality: Left;  RADIOACTIVE SEED GUIDED EXCISIONAL LEFT BREAST BIOPSY  . WISDOM TOOTH EXTRACTION      Current Outpatient Prescriptions  Medication Sig Dispense Refill  . Cholecalciferol (VITAMIN D PO) Take by mouth daily.    Marland Kitchen levonorgestrel (MIRENA) 20 MCG/24HR IUD 1 each by Intrauterine route once.    . metoprolol tartrate (LOPRESSOR) 25 MG tablet Take 0.5 tablets (12.5 mg total) by mouth 2 (two) times daily. (Patient taking differently: Take 12.5 mg by mouth as needed. ) 30 tablet 6  . Omega-3 Fatty Acids (FISH OIL PO) Take by mouth daily.    . TURMERIC PO Take by mouth daily.    . vitamin B-12 (CYANOCOBALAMIN) 1000 MCG tablet Take 1,000 mcg by mouth daily.     No current facility-administered medications for this visit.      ALLERGIES: Patient has no known allergies.  Family History  Problem Relation Age of Onset  . CAD Father   . Hypertension Father   . Hyperlipidemia Father   . Stroke Paternal Grandmother     Social History   Social History  . Marital status: Married    Spouse name: N/A  . Number of children: N/A  . Years  of education: N/A   Occupational History  . Not on file.   Social History Main Topics  . Smoking status: Never Smoker  . Smokeless tobacco: Never Used  . Alcohol use 3.0 oz/week    5 Standard drinks or equivalent per week     Comment: 5 glassed wine/beer/week  . Drug use: No  . Sexual activity: Yes    Partners: Male    Birth control/ protection: IUD     Comment: Mirena IUD inserted 2012/2013   Other Topics Concern  . Not on file   Social History Narrative  . No narrative on file    ROS:  Pertinent items are noted in HPI.  PHYSICAL EXAMINATION:    BP 104/68 (BP Location: Right Arm, Patient Position: Sitting, Cuff Size: Normal)   Pulse 66   Resp 14   Ht 5' 3.25" (1.607 m)   Wt 120 lb 12.8 oz (54.8 kg)   BMI 21.23 kg/m      General appearance: alert, cooperative and appears stated age   Pelvic: External genitalia:  Sebaceous cysts of the bilateral labia.               Urethra:  normal appearing urethra with no masses, tenderness or lesions              Bartholins and Skenes: normal                 Vagina: normal appearing vagina with normal color and discharge, no lesions              Cervix: no lesions.  String length 1.5 cm.                 Bimanual Exam:  Uterus:  normal size, contour, position, consistency, mobility, non-tender               Chaperone was present for exam.  ASSESSMENT  Mirena IUD.  Doing well.  Sebaceous cysts.  PLAN  Discussed how to determine if menopausal with the IUD in place - [facility], estradiol. Discussed use of Mirena for progesterone side of HRT. Do not manipulate sebaceous cysts in order to reduce chance of getting cellulitis.  Follow up for annual exam and mammogram in May/June 2019.    An After Visit Summary was printed and given to the patient.  ___15___ minutes face to face time of which over 50% was spent in counseling.

## 2017-08-02 ENCOUNTER — Encounter: Payer: Self-pay | Admitting: Internal Medicine

## 2017-08-10 ENCOUNTER — Ambulatory Visit: Payer: 59 | Admitting: Internal Medicine

## 2017-12-31 IMAGING — RF DG FLUORO GUIDE NDL PLC/BX
2 series · 2 of 2 positions shown · IV contrast (multihance)
Comparison: none

CLINICAL DATA: RIGHT shoulder pain

EXAM:
RIGHT SHOULDER INJECTION UNDER FLUOROSCOPY
TECHNIQUE: An appropriate skin entrance site was determined. The site was
marked, prepped with Betadine, draped in the usual sterile fashion,
and infiltrated locally with buffered Lidocaine. 22 gauge spinal
needle was advanced to the superomedial margin of the humeral head
under intermittent fluoroscopy. 1 ml of Lidocaine injected easily. A
mixture of 0.05 ml Multihance, 10 ml of Omnipaque 300, 5 mL
lidocaine and 5 mL saline was then used to opacify the RIGHT
shoulder capsule. No immediate complication.
FLUOROSCOPY TIME:  Fluoroscopy Time:  1 minutes 12 seconds
Radiation Exposure Index (if provided by the fluoroscopic device):
5.3
Number of Acquired Spot Images: 2

[Series 1: run · 1 of 1 slices shown (1 of 2)]
[im 1/1]
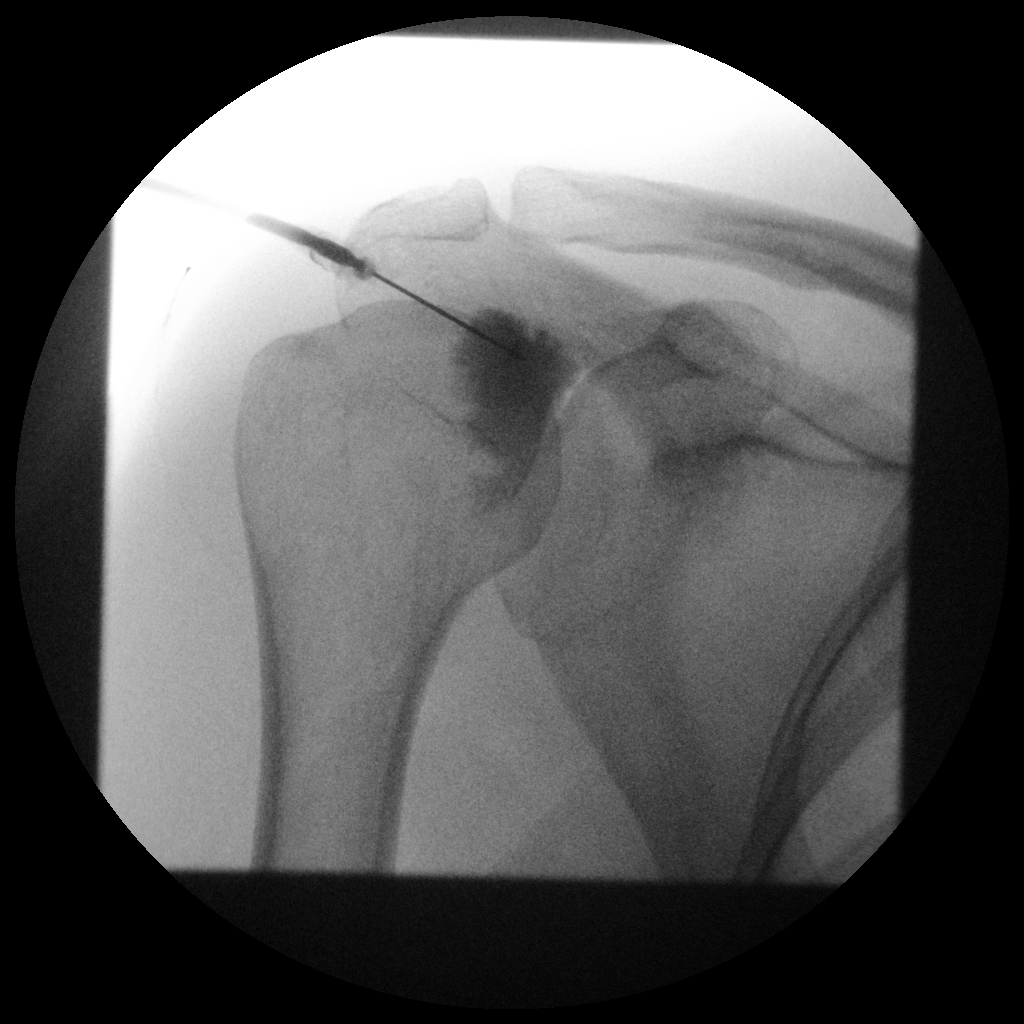

[Series 2: run · 1 of 1 slices shown (2 of 2)]
[im 1/1]
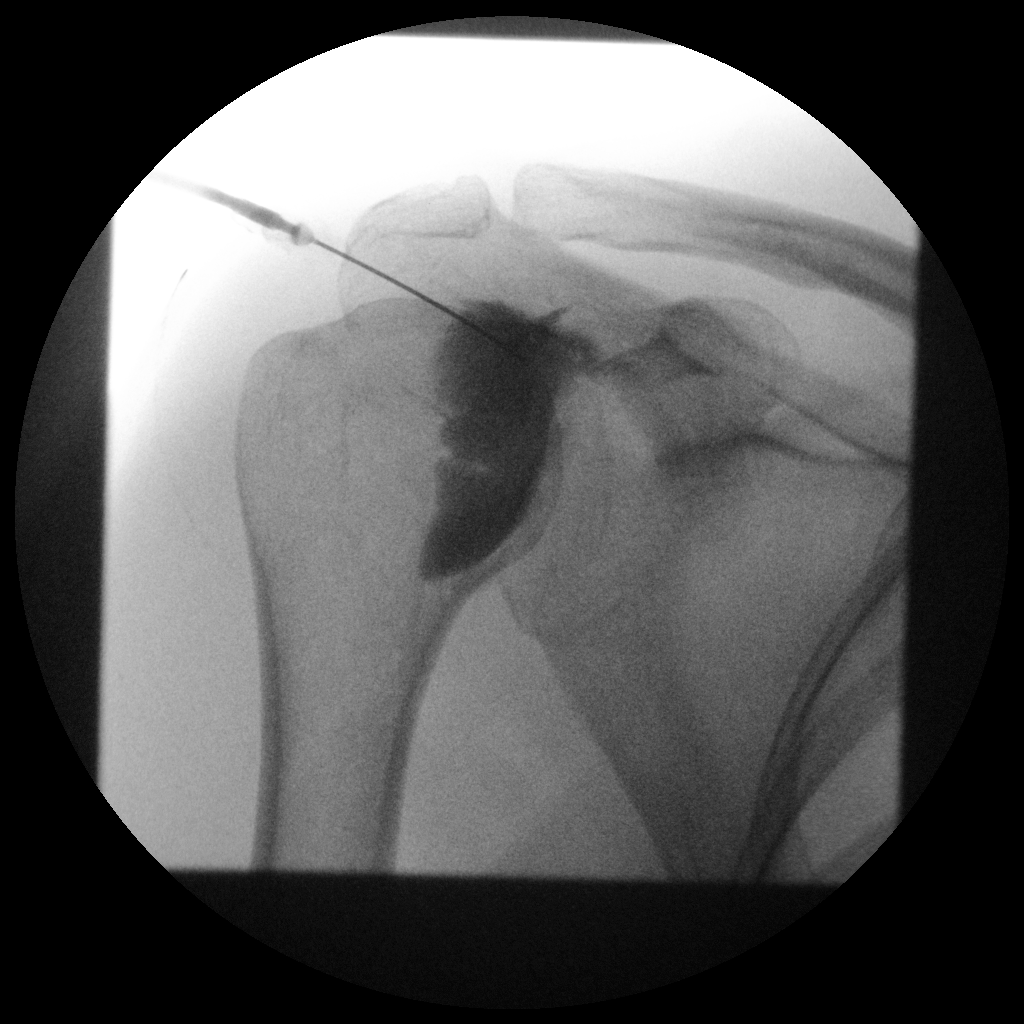

[2 of 2 positions shown; findings below may reference images not displayed]

FINDINGS: Contrast injected over the RIGHT joint. Query intramuscular
injection. Correlate with MRI.
IMPRESSION: Technically successful RIGHT shoulder injection for MRI.

## 2017-12-31 IMAGING — MR MR SHOULDER*R* W/CM
5 of 12 series · 18 of 40 positions shown · IV contrast (Yes)
Comparison: Injection images same date.

CLINICAL DATA: Right shoulder pain for 1 year, worsening over the
last 1.5 months. No previous relevant surgery.

EXAM:
MR ARTHROGRAM OF THE RIGHT SHOULDER
TECHNIQUE: Multiplanar, multisequence MR imaging of the right shoulder was
performed following the administration of intra-articular contrast.
CONTRAST:  See Injection Documentation.

[Series 2: T1 fat-sat · axial · 4.0mm · 0.23mm/px · z∈[-62,+33]mm · 5 of 20 slices shown]
[im 1/20]
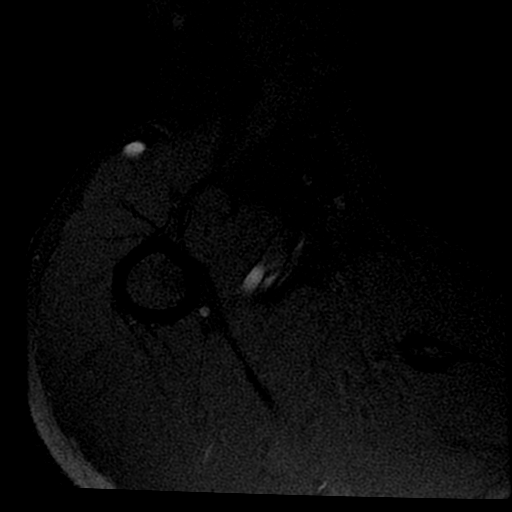
[im 5/20]
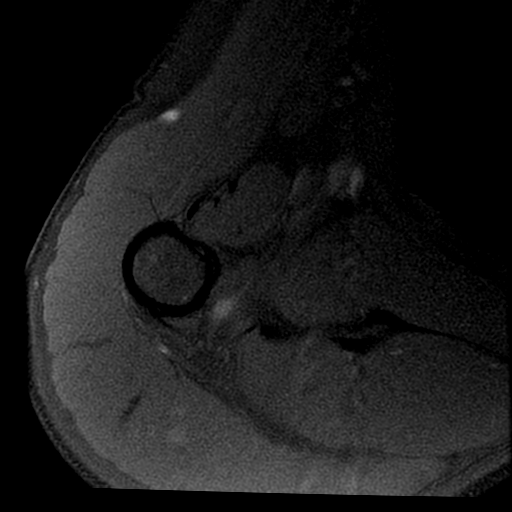
[im 10/20]
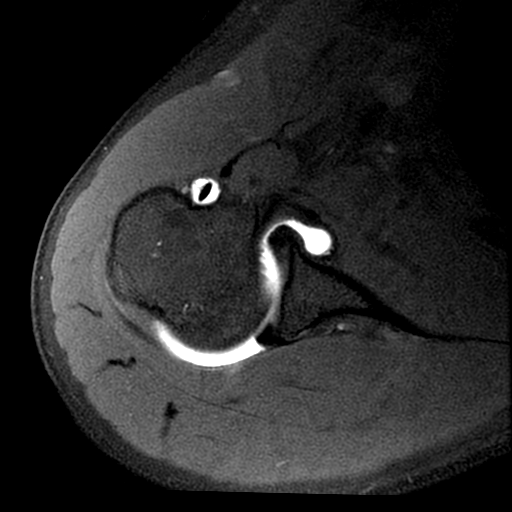
[im 15/20]
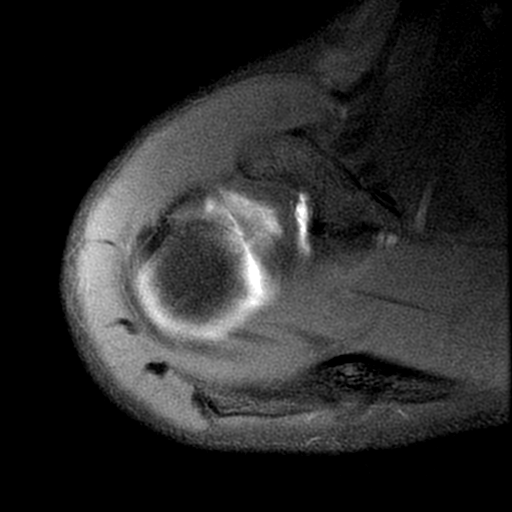
[im 20/20]
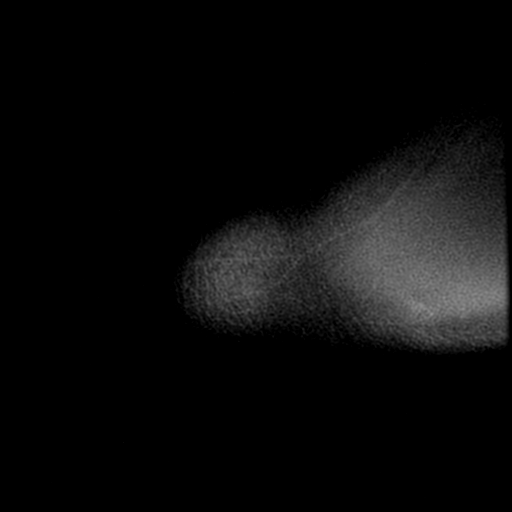

[Series 5: T2 fat-sat · sagittal · 4.0mm · 0.29mm/px · 3 of 17 slices shown (1 of 4)]
[im 1/17]
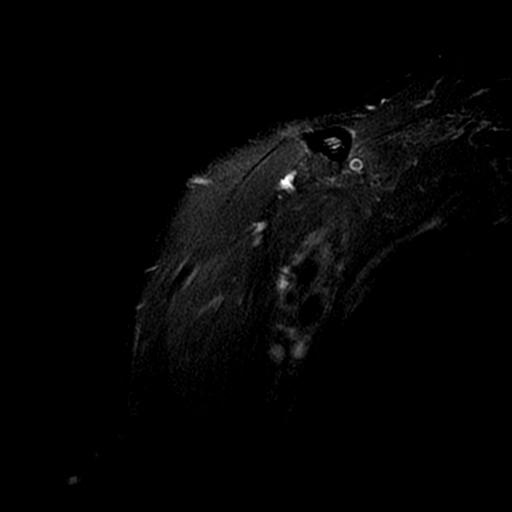
[im 9/17]
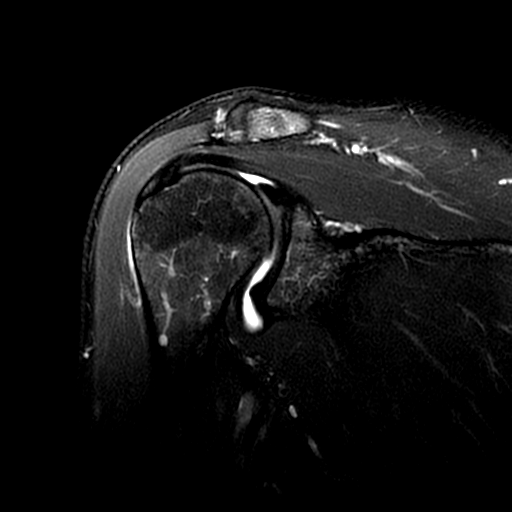
[im 17/17]
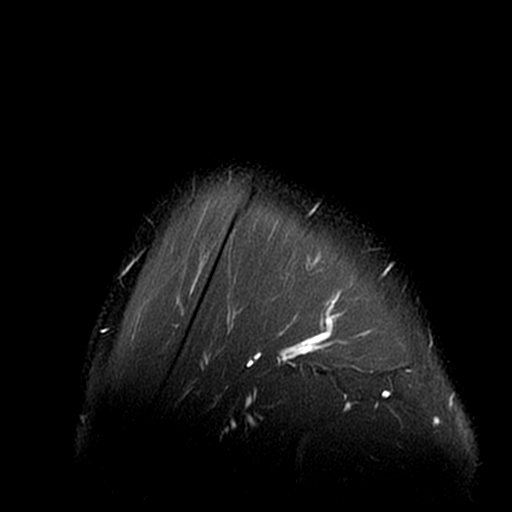

[Series 6: T2 fat-sat · oblique · 4.0mm · 0.29mm/px · 3 of 18 slices shown (2 of 4)]
[im 1/18]
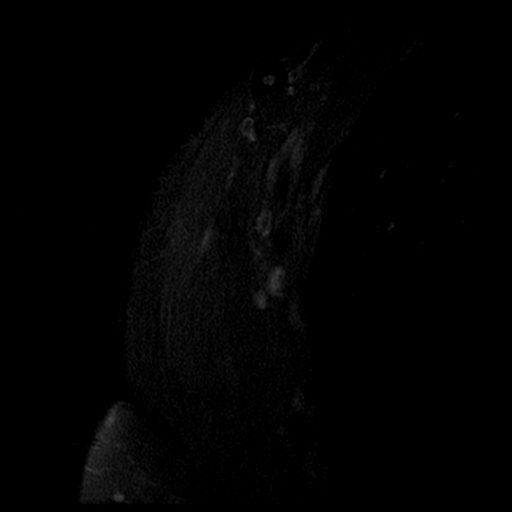
[im 9/18]
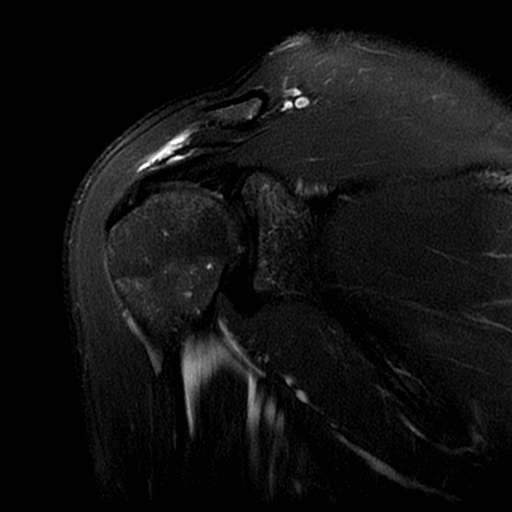
[im 18/18]
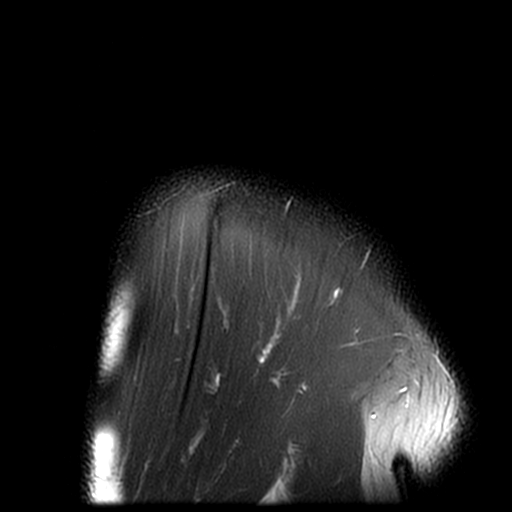

[Series 6: T2 fat-sat · oblique · 4.0mm · 0.29mm/px · 4 of 20 slices shown (3 of 4)]
[im 1/20]
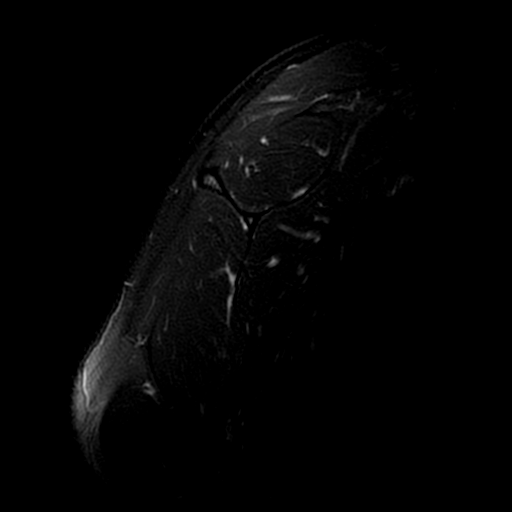
[im 7/20]
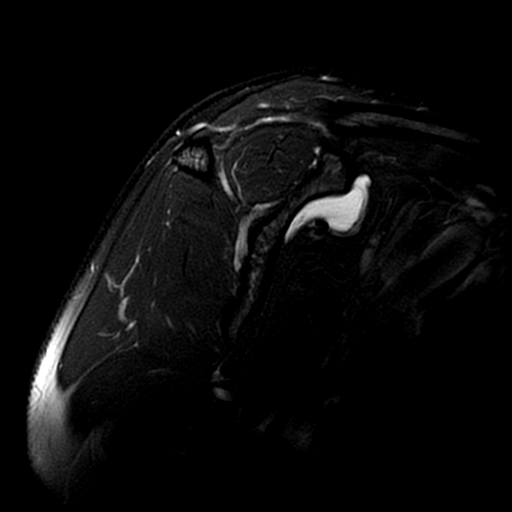
[im 13/20]
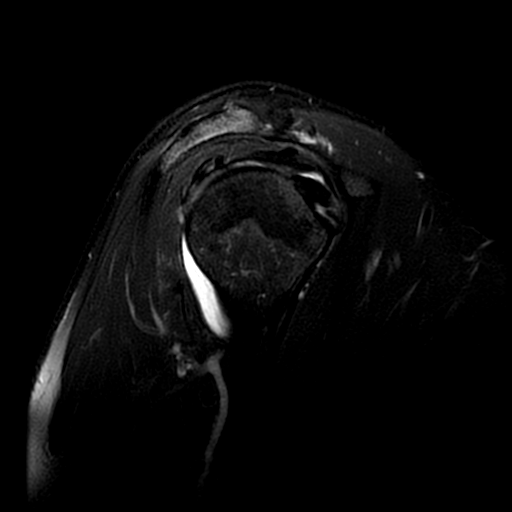
[im 20/20]
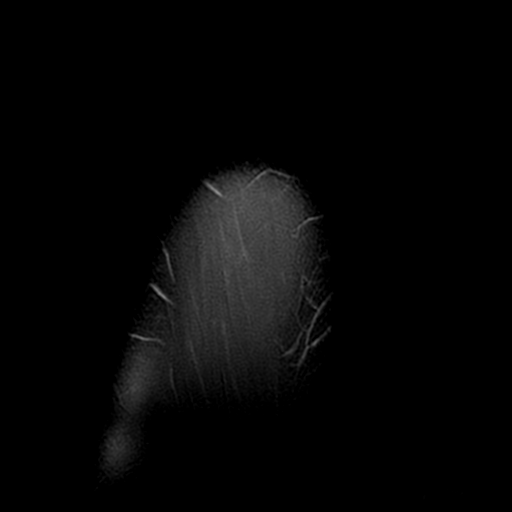

[Series 7: T2 fat-sat · coronal · 4.0mm · 0.29mm/px · 3 of 18 slices shown (4 of 4)]
[im 1/18]
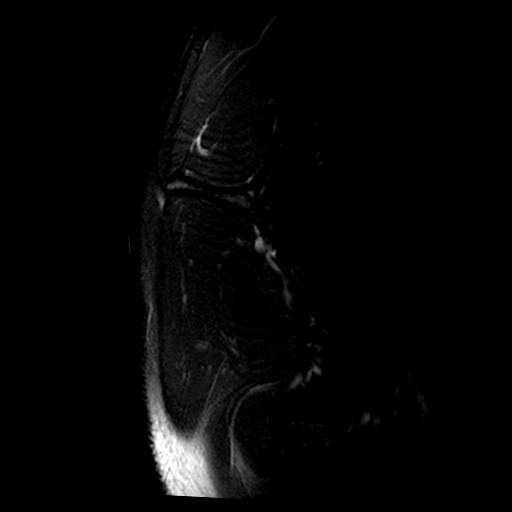
[im 9/18]
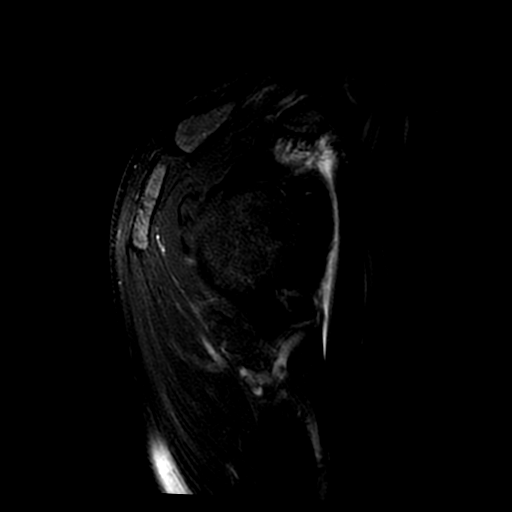
[im 18/18]
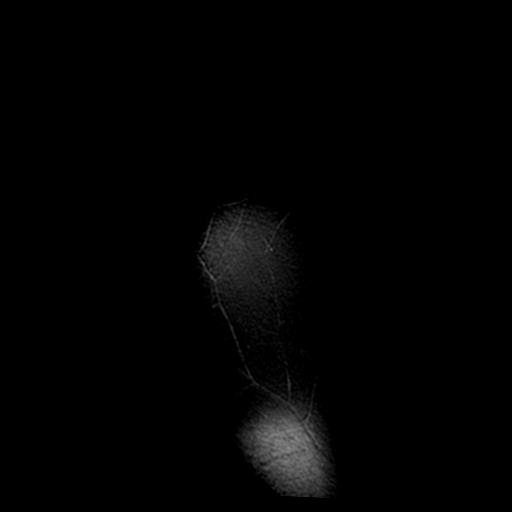

[18 of 40 positions shown; findings below may reference images not displayed]

FINDINGS: Initial injection and MRI were performed on 08/17/2016.
Intra-articular injection was not successful at that time. The
patient was returned for a repeat injection and MRI on 08/23/2016.

Rotator cuff: Prominent T2 hyperintensity along the posterior aspect
of the humeral head, surrounding the teres minor tendon. This is
present on both days and associated with underlying teres minor
tendinosis. The supraspinatus, infraspinatus and subscapularis
tendons demonstrate no significant findings (allowing for the
injection, felt to account for mildly increased signal in the
subscapularis tendon on the [DATE] examination). No full-thickness
rotator cuff tear or contrast leakage from the joint seen.

Muscles:  No focal muscular atrophy or edema.

Biceps long head:  Intact and normally positioned.

Acromioclavicular Joint: The acromion is type 2. There are mild
acromioclavicular degenerative changes. Small amount of fluid
posteriorly in the subdeltoid bursa adjacent to distal teres minor
tendon.

Glenohumeral Joint: The shoulder joint is adequately distended with
contrast. No glenohumeral arthropathy. The glenohumeral ligaments
are intact.

Labrum:  No evidence of labral tear.

Bones: There is reactive marrow edema posteriorly in the humeral
head near the infraspinatus insertion. This is present on both days.
No other significant osseous findings.

Other: No significant soft tissue findings.
IMPRESSION: 1. Atypical severe teres minor tendinosis with adjacent subdeltoid
bursal fluid and reactive edema posteriorly in the humeral head. No
calcific component identified. No full-thickness rotator cuff tear
or muscular atrophy.
2. The additional components of the rotator cuff appear normal.
3. Intact labrum and biceps tendon.

## 2018-01-10 ENCOUNTER — Other Ambulatory Visit: Payer: Self-pay | Admitting: Obstetrics and Gynecology

## 2018-01-10 DIAGNOSIS — Z1231 Encounter for screening mammogram for malignant neoplasm of breast: Secondary | ICD-10-CM

## 2018-02-01 ENCOUNTER — Ambulatory Visit
Admission: RE | Admit: 2018-02-01 | Discharge: 2018-02-01 | Disposition: A | Discharge status: Home / Self Care | Payer: 59 | Source: Ambulatory Visit | Attending: Obstetrics and Gynecology | Admitting: Obstetrics and Gynecology

## 2018-02-01 DIAGNOSIS — Z1231 Encounter for screening mammogram for malignant neoplasm of breast: Secondary | ICD-10-CM | POA: Diagnosis not present

## 2018-02-04 ENCOUNTER — Other Ambulatory Visit: Payer: Self-pay

## 2018-02-04 ENCOUNTER — Ambulatory Visit (INDEPENDENT_AMBULATORY_CARE_PROVIDER_SITE_OTHER): Payer: 59 | Admitting: Obstetrics and Gynecology

## 2018-02-04 ENCOUNTER — Encounter: Payer: Self-pay | Admitting: Obstetrics and Gynecology

## 2018-02-04 VITALS — BP 100/56 | HR 84 | Resp 16 | Ht 63.5 in | Wt 121.0 lb

## 2018-02-04 DIAGNOSIS — Z01419 Encounter for gynecological examination (general) (routine) without abnormal findings: Secondary | ICD-10-CM

## 2018-02-04 DIAGNOSIS — Z1211 Encounter for screening for malignant neoplasm of colon: Secondary | ICD-10-CM

## 2018-02-04 MED ORDER — ESTROGENS, CONJUGATED 0.625 MG/GM VA CREA: TOPICAL_CREAM | VAGINAL | 1 refills | Status: DC

## 2018-02-04 NOTE — Patient Instructions (Signed)

## 2018-02-04 NOTE — Progress Notes (Signed)
47 y.o. G5P2002 Married Caucasian female here for annual exam.    Gets a low feeling around 10:30 or 11:00.  Feels slow.  Does not occur at work.  Eats, and this does help.  She had a low glucose at 48 last year with her routine labs.   No bleeding with Mirena.   Still with leakage with cough or sneeze.   Perineum sore following intercourse.  Has occurred since birth of her first child.  Perineum is not sore if not having intercourse.  PCP: Dr. Laurann Montana     No LMP recorded (lmp unknown). (Menstrual status: IUD).           Sexually active: Yes.    The current method of family planning is Mirena IUD inserted 05/03/17.    Exercising: Yes.    walking, bike riding, and yard work Smoker:  no  Health Maintenance: Pap:  01/20/16 Pap and HR HPV negative History of abnormal Pap:  no MMG:  02/01/18 not resulted -- TBC Colonoscopy:  n/a BMD:   n/a  Result  n/a TDaP:  2017 Gardasil:   no HIV: negative with pregnancy Screening Labs:  Discuss today   reports that she has never smoked. She has never used smokeless tobacco. She reports that she drinks about 3.0 oz of alcohol per week. She reports that she does not use drugs.  Past Medical History:  Diagnosis Date  . Complication of anesthesia    very sensitive to aneth and pain meds  . DDD (degenerative disc disease), lumbar   . Medical history non-contributory   . PONV (postoperative nausea and vomiting)     Past Surgical History:  Procedure Laterality Date  . BREAST BIOPSY Left 02/03/2016   U/S- Benign  . BREAST BIOPSY  04/24/2007   U/S Core- Benign  . BREAST EXCISIONAL BIOPSY Left 02/23/2016  . BREAST SURGERY  2005   Benign Rt.breast Bx  . HEMILAMINOTOMY LUMBAR SPINE Left 06/03/2008   L5-S1; with microdiscectomy and medial facetectomy  . MASS EXCISION Right 04/03/2013   Procedure:  MASS EXCISION AND BIOPSY;  Surgeon: Linna Hoff, MD;  Location: [place];  Service: Orthopedics;  Laterality: Right;  .  RADIOACTIVE SEED GUIDED EXCISIONAL BREAST BIOPSY Left 02/24/2016   Procedure: RADIOACTIVE SEED GUIDED EXCISIONAL LEFT BREAST BIOPSY;  Surgeon: Rolm Bookbinder, MD;  Location: [place];  Service: General;  Laterality: Left;  RADIOACTIVE SEED GUIDED EXCISIONAL LEFT BREAST BIOPSY  . WISDOM TOOTH EXTRACTION      Current Outpatient Medications  Medication Sig Dispense Refill  . Cholecalciferol (VITAMIN D PO) Take by mouth daily.    Marland Kitchen levonorgestrel (MIRENA) 20 MCG/24HR IUD 1 each by Intrauterine route once.    . metoprolol tartrate (LOPRESSOR) 25 MG tablet Take 0.5 tablets (12.5 mg total) by mouth 2 (two) times daily. (Patient taking differently: Take 12.5 mg by mouth as needed. ) 30 tablet 6  . Omega-3 Fatty Acids (FISH OIL PO) Take by mouth daily.    . TURMERIC PO Take by mouth daily.    . vitamin B-12 (CYANOCOBALAMIN) 1000 MCG tablet Take 1,000 mcg by mouth daily.     No current facility-administered medications for this visit.     Family History  Problem Relation Age of Onset  . CAD Father   . Hypertension Father   . Hyperlipidemia Father   . Stroke Paternal Grandmother     Review of Systems  Constitutional: Negative.   HENT: Negative.   Eyes: Negative.  Respiratory: Negative.   Cardiovascular: Negative.   Gastrointestinal: Negative.   Endocrine: Negative.   Genitourinary: Negative.   Musculoskeletal: Negative.   Skin: Negative.   Allergic/Immunologic: Negative.   Neurological: Negative.   Hematological: Negative.   Psychiatric/Behavioral: Negative.     Exam:   BP (!) 100/56 (BP Location: Right Arm, Patient Position: Sitting, Cuff Size: Normal)   Pulse 84   Resp 16   Ht 5' 3.5" (1.613 m)   Wt 121 lb (54.9 kg)   LMP  (LMP Unknown)   BMI 21.10 kg/m     General appearance: alert, cooperative and appears stated age Head: Normocephalic, without obvious abnormality, atraumatic Neck: no adenopathy, supple, symmetrical, trachea midline and thyroid normal  to inspection and palpation Lungs: clear to auscultation bilaterally Breasts: normal appearance, no masses or tenderness, No nipple retraction or dimpling, No nipple discharge or bleeding, No axillary or supraclavicular adenopathy Heart: regular rate and rhythm Abdomen: soft, non-tender; no masses, no organomegaly Extremities: extremities normal, atraumatic, no cyanosis or edema Skin: Skin color, texture, turgor normal. No rashes or lesions Lymph nodes: Cervical, supraclavicular, and axillary nodes normal. No abnormal inguinal nodes palpated Neurologic: Grossly normal  Pelvic: External genitalia:  Perineum with 2 - 4 mm ulceration in midline.               Urethra:  normal appearing urethra with no masses, tenderness or lesions              Bartholins and Skenes: normal                 Vagina: normal appearing vagina with normal color and discharge, no lesions              Cervix: no lesions.  IUD strings noted.               Pap taken: No. Bimanual Exam:  Uterus:  normal size, contour, position, consistency, mobility, non-tender              Adnexa: no mass, fullness, tenderness              Rectal exam: Yes.  .  Confirms.              Anus:  normal sphincter tone, no lesions  Chaperone was present for exam.  Assessment:   Well woman visit with normal exam. Mirena IUD.  Status post left breast biopsy. GSI.  Perineal ulceration.  Malaise.   Plan: Mammogram screening. Recommended self breast awareness. Pap and HR HPV as above. Guidelines for Calcium, Vitamin D, regular exercise program including cardiovascular and weight bearing exercise. Rx Premarin cream 1/2 gm pv at hs x 2 weeks and then 1/2 gm pv at hs 2 -3 times per week.  Avoid intercourse for 1 month.  Return if symptoms persist.  Would then consider biopsy.  I did discuss perineal revision as an option as well.  Patient will stop caffeine, hydrate more, and increase proteins and fats in her diet.  She will try  monitoring her blood pressure at home.  She will see if she can check her blood sugar using a family members glucose monitor.   She will make an appointment to see her PCP in follow up.  Follow up annually and prn.   After visit summary provided.

## 2018-02-05 ENCOUNTER — Other Ambulatory Visit: Payer: Self-pay | Admitting: Obstetrics and Gynecology

## 2018-02-05 DIAGNOSIS — R928 Other abnormal and inconclusive findings on diagnostic imaging of breast: Secondary | ICD-10-CM

## 2018-02-07 ENCOUNTER — Other Ambulatory Visit: Payer: Self-pay | Admitting: Obstetrics and Gynecology

## 2018-02-07 ENCOUNTER — Ambulatory Visit
Admission: RE | Admit: 2018-02-07 | Discharge: 2018-02-07 | Disposition: A | Discharge status: Home / Self Care | Payer: 59 | Source: Ambulatory Visit | Attending: Obstetrics and Gynecology | Admitting: Obstetrics and Gynecology

## 2018-02-07 DIAGNOSIS — R928 Other abnormal and inconclusive findings on diagnostic imaging of breast: Secondary | ICD-10-CM

## 2018-02-07 DIAGNOSIS — N6320 Unspecified lump in the left breast, unspecified quadrant: Secondary | ICD-10-CM

## 2018-02-07 DIAGNOSIS — N6322 Unspecified lump in the left breast, upper inner quadrant: Secondary | ICD-10-CM | POA: Diagnosis not present

## 2018-02-07 DIAGNOSIS — N6002 Solitary cyst of left breast: Secondary | ICD-10-CM | POA: Diagnosis not present

## 2018-02-07 DIAGNOSIS — N6012 Diffuse cystic mastopathy of left breast: Secondary | ICD-10-CM | POA: Diagnosis not present

## 2018-02-13 MED FILL — PREMARIN VAGINAL CREAM-APPL: 0.625 | 90 days supply | Qty: 30 | Fill #0

## 2018-05-19 ENCOUNTER — Telehealth: Payer: Self-pay | Admitting: Obstetrics and Gynecology

## 2018-05-19 NOTE — Telephone Encounter (Signed)
Please contact patient to remind her to send her IFOB in to the lab.  She came up in my reminder box in Epic.  

## 2018-05-20 NOTE — Telephone Encounter (Signed)
My chart message sent reminding patient about IFOB sample.

## 2018-07-23 DIAGNOSIS — H524 Presbyopia: Secondary | ICD-10-CM | POA: Diagnosis not present

## 2018-10-08 DIAGNOSIS — M542 Cervicalgia: Secondary | ICD-10-CM | POA: Diagnosis not present

## 2018-10-08 DIAGNOSIS — R531 Weakness: Secondary | ICD-10-CM | POA: Diagnosis not present

## 2018-10-08 DIAGNOSIS — Z6820 Body mass index (BMI) 20.0-20.9, adult: Secondary | ICD-10-CM | POA: Diagnosis not present

## 2018-10-08 DIAGNOSIS — R002 Palpitations: Secondary | ICD-10-CM | POA: Diagnosis not present

## 2018-10-08 DIAGNOSIS — R5383 Other fatigue: Secondary | ICD-10-CM | POA: Diagnosis not present

## 2019-02-14 ENCOUNTER — Ambulatory Visit: Payer: 59 | Admitting: Obstetrics and Gynecology

## 2019-02-14 ENCOUNTER — Other Ambulatory Visit: Payer: Self-pay

## 2019-02-18 ENCOUNTER — Other Ambulatory Visit: Payer: Self-pay

## 2019-02-18 ENCOUNTER — Encounter: Payer: Self-pay | Admitting: Obstetrics and Gynecology

## 2019-02-18 ENCOUNTER — Ambulatory Visit (INDEPENDENT_AMBULATORY_CARE_PROVIDER_SITE_OTHER): Payer: 59 | Admitting: Obstetrics and Gynecology

## 2019-02-18 ENCOUNTER — Other Ambulatory Visit (HOSPITAL_COMMUNITY)
Admission: RE | Admit: 2019-02-18 | Discharge: 2019-02-18 | Disposition: A | Discharge status: Home / Self Care | Payer: 59 | Source: Ambulatory Visit | Attending: Obstetrics and Gynecology | Admitting: Obstetrics and Gynecology

## 2019-02-18 VITALS — BP 104/60 | HR 88 | Temp 97.3°F | Resp 14 | Ht 63.25 in | Wt 121.4 lb

## 2019-02-18 DIAGNOSIS — Z01419 Encounter for gynecological examination (general) (routine) without abnormal findings: Secondary | ICD-10-CM | POA: Insufficient documentation

## 2019-02-18 NOTE — Patient Instructions (Signed)

## 2019-02-18 NOTE — Progress Notes (Signed)
48 y.o. G74P2002 Married Caucasian female here for annual exam.    Had 2 episodes of pelvic pain since her last visit.  She felt like she had 2 spasms similar to childbirth.  No bleeding.  Still some leakage of urine with coughing.  Not necessarily leaking with exercise. Difficulty stopping her bladder stream.   Working through [employer] pandemic.   Labs with PCP.  She has low vit D.   PCP: Rachell Cipro, MD    No LMP recorded (lmp unknown). (Menstrual status: IUD).           Sexually active: Yes.    The current method of family planning is Mirena IUD inserted 05/03/17.    Exercising: Yes.    yard work Smoker:  no  Health Maintenance: Pap:  01/20/16 Pap and HR HPV negative History of abnormal Pap:  no MMG:  02/01/18 BIRADS 0 Incompleted; 02/07/18 [place] Left Breast - BIRADS 4:Suspicious; 02/08/18 MM Clip Placement/Breast Biopsy -- pathology revealed benign breast tissue with fibrocystic change at 11 o'clock. TDaP:  01/20/16 Gardasil:   no HIV: negative in the past Hep C: never Screening Labs: PCP   reports that she has never smoked. She has never used smokeless tobacco. She reports current alcohol use of about 5.0 standard drinks of alcohol per week. She reports that she does not use drugs.  Past Medical History:  Diagnosis Date  . Complication of anesthesia    very sensitive to aneth and pain meds  . DDD (degenerative disc disease), lumbar   . Medical history non-contributory   . PONV (postoperative nausea and vomiting)     Past Surgical History:  Procedure Laterality Date  . BREAST BIOPSY Left 02/03/2016   U/S- Benign  . BREAST BIOPSY  04/24/2007   U/S Core- Benign  . BREAST EXCISIONAL BIOPSY Left 02/23/2016  . BREAST SURGERY  2005   Benign Rt.breast Bx  . HEMILAMINOTOMY LUMBAR SPINE Left 06/03/2008   L5-S1; with microdiscectomy and medial facetectomy  . MASS EXCISION Right 04/03/2013   Procedure:  MASS EXCISION AND BIOPSY;  Surgeon: Linna Hoff, MD;  Location: [place];  Service: Orthopedics;  Laterality: Right;  . RADIOACTIVE SEED GUIDED EXCISIONAL BREAST BIOPSY Left 02/24/2016   Procedure: RADIOACTIVE SEED GUIDED EXCISIONAL LEFT BREAST BIOPSY;  Surgeon: Rolm Bookbinder, MD;  Location: [place];  Service: General;  Laterality: Left;  RADIOACTIVE SEED GUIDED EXCISIONAL LEFT BREAST BIOPSY  . WISDOM TOOTH EXTRACTION      Current Outpatient Medications  Medication Sig Dispense Refill  . Cholecalciferol (VITAMIN D PO) Take by mouth daily.    Marland Kitchen conjugated estrogens (PREMARIN) vaginal cream Use 1/2 g vaginally every night at bed time for the first 2 weeks, then use 1/2 g vaginally two or three time per week. 30 g 1  . levonorgestrel (MIRENA) 20 MCG/24HR IUD 1 each by Intrauterine route once.    . vitamin B-12 (CYANOCOBALAMIN) 1000 MCG tablet Take 1,000 mcg by mouth daily.    . metoprolol tartrate (LOPRESSOR) 25 MG tablet Take 0.5 tablets (12.5 mg total) by mouth 2 (two) times daily. (Patient not taking: Reported on 02/18/2019) 30 tablet 6   No current facility-administered medications for this visit.     Family History  Problem Relation Age of Onset  . CAD Father   . Hypertension Father   . Hyperlipidemia Father   . Stroke Paternal Grandmother     Review of Systems  Constitutional: Negative.   HENT: Negative.   Eyes:  Negative.   Respiratory: Negative.   Cardiovascular: Negative.   Gastrointestinal: Negative.   Endocrine: Negative.   Genitourinary: Negative.   Musculoskeletal: Negative.   Skin: Negative.   Allergic/Immunologic: Negative.   Neurological: Negative.   Hematological: Negative.   Psychiatric/Behavioral: Negative.     Exam:   BP 104/60 (BP Location: Left Arm, Patient Position: Sitting, Cuff Size: Large)   Pulse 88   Temp (!) 97.3 F (36.3 C) (Temporal)   Resp 14   Ht 5' 3.25" (1.607 m)   Wt 121 lb 6.4 oz (55.1 kg)   LMP  (LMP Unknown)   BMI 21.34 kg/m     General appearance: alert,  cooperative and appears stated age Head: normocephalic, without obvious abnormality, atraumatic Neck: no adenopathy, supple, symmetrical, trachea midline and thyroid normal to inspection and palpation Lungs: clear to auscultation bilaterally Breasts: normal appearance, no masses or tenderness, No nipple retraction or dimpling, No nipple discharge or bleeding, No axillary adenopathy Heart: regular rate and rhythm Abdomen: soft, non-tender; no masses, no organomegaly Extremities: extremities normal, atraumatic, no cyanosis or edema Skin: skin color, texture, turgor normal. No rashes or lesions Lymph nodes: cervical, supraclavicular, and axillary nodes normal. Neurologic: grossly normal  Pelvic: External genitalia:  no lesions              No abnormal inguinal nodes palpated.              Urethra:  normal appearing urethra with no masses, tenderness or lesions              Bartholins and Skenes: normal                 Vagina: normal appearing vagina with normal color and discharge, no lesions              Cervix: no lesions.  IUD strings noted.              Pap taken: Yes.   Bimanual Exam:  Uterus:  normal size, contour, position, consistency, mobility, non-tender              Adnexa: no mass, fullness, tenderness              Rectal exam: Yes.  .  Confirms.              Anus:  normal sphincter tone, no lesions  Chaperone was present for exam.  Assessment:   Well woman visit with normal exam. Mirena IUD.  Status post left breast biopsy. GSI. Perineal thinning and pain/bleeding with intercourse.  Plan: Mammogram screening discussed.  She will schedule.  Self breast awareness reviewed. Pap and HR HPV as above. Guidelines for Calcium, Vitamin D, regular exercise program including cardiovascular and weight bearing exercise. She will let me know if she would like pelvic floor therapy in the future.  She will use premarin vaginal cream at hs for 2 weeks and then twice to three times  weekly. Follow up annually and prn.   After visit summary provided.

## 2019-02-20 LAB — CYTOLOGY - PAP
Diagnosis: NEGATIVE
HPV: NOT DETECTED

## 2019-12-09 DIAGNOSIS — R2230 Localized swelling, mass and lump, unspecified upper limb: Secondary | ICD-10-CM | POA: Insufficient documentation

## 2019-12-09 DIAGNOSIS — R2231 Localized swelling, mass and lump, right upper limb: Secondary | ICD-10-CM | POA: Diagnosis not present

## 2019-12-09 DIAGNOSIS — M79641 Pain in right hand: Secondary | ICD-10-CM | POA: Diagnosis not present

## 2019-12-30 ENCOUNTER — Encounter (HOSPITAL_BASED_OUTPATIENT_CLINIC_OR_DEPARTMENT_OTHER): Payer: Self-pay | Admitting: Orthopedic Surgery

## 2019-12-30 ENCOUNTER — Other Ambulatory Visit: Payer: Self-pay

## 2019-12-30 NOTE — H&P (Signed)
Stacey Jordan is an 49 y.o. female.   Chief Complaint: FINGER PAIN  HPI: The patient is a 49 y/o right hand dominant female who has noticed a mass growing on the volar aspect of the index and long fingers without any known injury. They have been present for over a year and have started to become painful and cause limitations with motion. She had a mass removed from the right long finger in 2014 that was a granuloma.  She would like to have these masses removed since they are problematic.  She denies chest pain, shortness of breath, fever, chills, nausea, vomiting, or diarrhea.  She is here today for surgery.   Past Medical History:  Diagnosis Date  . Complication of anesthesia    very sensitive to aneth and pain meds  . DDD (degenerative disc disease), lumbar   . Medical history non-contributory   . PONV (postoperative nausea and vomiting)     Past Surgical History:  Procedure Laterality Date  . BREAST BIOPSY Left 02/03/2016   U/S- Benign  . BREAST BIOPSY  04/24/2007   U/S Core- Benign  . BREAST EXCISIONAL BIOPSY Left 02/23/2016  . BREAST SURGERY  2005   Benign Rt.breast Bx  . HEMILAMINOTOMY LUMBAR SPINE Left 06/03/2008   L5-S1; with microdiscectomy and medial facetectomy  . MASS EXCISION Right 04/03/2013   Procedure:  MASS EXCISION AND BIOPSY;  Surgeon: Linna Hoff, MD;  Location: [place];  Service: Orthopedics;  Laterality: Right;  . RADIOACTIVE SEED GUIDED EXCISIONAL BREAST BIOPSY Left 02/24/2016   Procedure: RADIOACTIVE SEED GUIDED EXCISIONAL LEFT BREAST BIOPSY;  Surgeon: Rolm Bookbinder, MD;  Location: [place];  Service: General;  Laterality: Left;  RADIOACTIVE SEED GUIDED EXCISIONAL LEFT BREAST BIOPSY  . WISDOM TOOTH EXTRACTION      Family History  Problem Relation Age of Onset  . CAD Father   . Hypertension Father   . Hyperlipidemia Father   . Stroke Paternal Grandmother    Social History:  reports that she has never smoked.  She has never used smokeless tobacco. She reports current alcohol use of about 5.0 standard drinks of alcohol per week. She reports that she does not use drugs.  Allergies: No Known Allergies  No medications prior to admission.    No results found for this or any previous visit (from the past 48 hour(s)). No results found.  ROS NO RECENT ILLNESSES OR HOSPITALIZATIONS  There were no vitals taken for this visit. Physical Exam  General Appearance:  Alert, cooperative, no distress, appears stated age  Head:  Normocephalic, without obvious abnormality, atraumatic  Eyes:  Pupils equal, conjunctiva/corneas clear,         Throat: Lips, mucosa, and tongue normal; teeth and gums normal  Neck: No visible masses     Lungs:   respirations unlabored  Chest Wall:  No tenderness or deformity  Heart:  Regular rate and rhythm,  Abdomen:   Soft, non-tender,         Extremities: RUE - WELL-MARGINATED MASS ON THE VOLAR ASPECT OF THE DISTAL PHALANX OF THE INDEX AND LONG FINGERS WITHOUT ERYTHEMA OR DRAINAGE. SENSATION INTACT TO LIGHT TOUCH. CAPILLARY REFILL LESS THAN 2 SECONDS. SLIGHT LIMITATION WITH FLEXION BUT ABLE TO FULLY EXTEND. TENDERNESS TO PALPATION OF MASSES.  Pulses: 2+ and symmetric  Skin: Skin color, texture, turgor normal, no rashes or lesions     Neurologic: Normal     Assessment RIGHT INDEX FINGER DISTAL PHALANX MASS RIGHT LONG FINGER  DISTAL PHALANX MASS   Plan RIGHT INDEX FINGER AND RIGHT LONG FINGER MASS EXCISION   WE ARE PLANNING SURGERY FOR YOUR UPPER EXTREMITY. THE RISKS AND BENEFITS OF SURGERY INCLUDE BUT NOT LIMITED TO BLEEDING INFECTION, DAMAGE TO NEARBY NERVES ARTERIES TENDONS, FAILURE OF SURGERY TO ACCOMPLISH ITS INTENDED GOALS, PERSISTENT SYMPTOMS AND NEED FOR FURTHER SURGICAL INTERVENTION. WITH THIS IN MIND WE WILL PROCEED. I HAVE DISCUSSED WITH THE PATIENT THE PRE AND POSTOPERATIVE REGIMEN AND THE DOS AND DON'TS. PT VOICED UNDERSTANDING AND INFORMED CONSENT  SIGNED.  R/B/A DISCUSSED WITH PT IN OFFICE.  PT VOICED UNDERSTANDING OF PLAN CONSENT SIGNED DAY OF SURGERY PT SEEN AND EXAMINED PRIOR TO OPERATIVE PROCEDURE/DAY OF SURGERY SITE MARKED. QUESTIONS ANSWERED WILL GO HOME FOLLOWING SURGERY  Stacey Jordan [facility] MD 01/07/20     Stacey Jordan 12/30/2019, 12:57 PM

## 2020-01-06 ENCOUNTER — Other Ambulatory Visit (HOSPITAL_COMMUNITY)
Admission: RE | Admit: 2020-01-06 | Discharge: 2020-01-06 | Disposition: A | Discharge status: Home / Self Care | Payer: 59 | Source: Ambulatory Visit | Attending: Orthopedic Surgery | Admitting: Orthopedic Surgery

## 2020-01-06 DIAGNOSIS — Z01812 Encounter for preprocedural laboratory examination: Secondary | ICD-10-CM | POA: Diagnosis not present

## 2020-01-06 DIAGNOSIS — Z20822 Contact with and (suspected) exposure to covid-19: Secondary | ICD-10-CM | POA: Diagnosis not present

## 2020-01-06 LAB — SARS CORONAVIRUS 2 (TAT 6-24 HRS): SARS Coronavirus 2: NEGATIVE

## 2020-01-07 ENCOUNTER — Ambulatory Visit (HOSPITAL_BASED_OUTPATIENT_CLINIC_OR_DEPARTMENT_OTHER)
Admission: RE | Admit: 2020-01-07 | Discharge: 2020-01-07 | Disposition: A | Discharge status: Home / Self Care | Payer: 59 | Attending: Orthopedic Surgery | Admitting: Orthopedic Surgery

## 2020-01-07 ENCOUNTER — Ambulatory Visit (HOSPITAL_BASED_OUTPATIENT_CLINIC_OR_DEPARTMENT_OTHER): Payer: 59 | Admitting: Anesthesiology

## 2020-01-07 ENCOUNTER — Encounter (HOSPITAL_BASED_OUTPATIENT_CLINIC_OR_DEPARTMENT_OTHER)
Admission: RE | Disposition: A | Discharge status: Home / Self Care | Payer: Self-pay | Source: Home / Self Care | Attending: Orthopedic Surgery

## 2020-01-07 ENCOUNTER — Encounter (HOSPITAL_BASED_OUTPATIENT_CLINIC_OR_DEPARTMENT_OTHER): Payer: Self-pay | Admitting: Orthopedic Surgery

## 2020-01-07 ENCOUNTER — Other Ambulatory Visit: Payer: Self-pay

## 2020-01-07 DIAGNOSIS — R2231 Localized swelling, mass and lump, right upper limb: Secondary | ICD-10-CM | POA: Insufficient documentation

## 2020-01-07 DIAGNOSIS — D481 Neoplasm of uncertain behavior of connective and other soft tissue: Secondary | ICD-10-CM | POA: Diagnosis not present

## 2020-01-07 DIAGNOSIS — M7711 Lateral epicondylitis, right elbow: Secondary | ICD-10-CM | POA: Diagnosis not present

## 2020-01-07 DIAGNOSIS — M7989 Other specified soft tissue disorders: Secondary | ICD-10-CM | POA: Diagnosis not present

## 2020-01-07 DIAGNOSIS — M5136 Other intervertebral disc degeneration, lumbar region: Secondary | ICD-10-CM | POA: Diagnosis not present

## 2020-01-07 HISTORY — PX: EXCISION METACARPAL MASS: SHX6372

## 2020-01-07 LAB — POCT PREGNANCY, URINE: Preg Test, Ur: NEGATIVE

## 2020-01-07 SURGERY — EXCISION METACARPAL MASS
Anesthesia: Monitor Anesthesia Care | Site: Finger | Laterality: Right

## 2020-01-07 MED ORDER — PROPOFOL 10 MG/ML IV BOLUS
INTRAVENOUS | Status: AC
  Filled 2020-01-07: qty 20

## 2020-01-07 MED ORDER — ONDANSETRON HCL 4 MG/2ML IJ SOLN
INTRAMUSCULAR | Status: DC | PRN
  Administered 2020-01-07: 4 mg via INTRAVENOUS

## 2020-01-07 MED ORDER — 0.9 % SODIUM CHLORIDE (POUR BTL) OPTIME
TOPICAL | Status: DC | PRN
  Administered 2020-01-07: 14:00:00 1000 mL

## 2020-01-07 MED ORDER — PROPOFOL 10 MG/ML IV BOLUS
INTRAVENOUS | Status: DC | PRN
  Administered 2020-01-07: 30 mg via INTRAVENOUS
  Administered 2020-01-07: 50 mg via INTRAVENOUS

## 2020-01-07 MED ORDER — CEFAZOLIN SODIUM-DEXTROSE 2-4 GM/100ML-% IV SOLN
INTRAVENOUS | Status: AC
  Filled 2020-01-07: qty 100

## 2020-01-07 MED ORDER — CEFAZOLIN SODIUM-DEXTROSE 2-4 GM/100ML-% IV SOLN
2.0000 g | INTRAVENOUS | Status: AC
  Administered 2020-01-07: 13:00:00 2 g via INTRAVENOUS

## 2020-01-07 MED ORDER — PROPOFOL 500 MG/50ML IV EMUL
INTRAVENOUS | Status: DC | PRN
  Administered 2020-01-07: 150 ug/kg/min via INTRAVENOUS

## 2020-01-07 MED ORDER — SCOPOLAMINE 1 MG/3DAYS TD PT72
1.0000 | MEDICATED_PATCH | Freq: Once | TRANSDERMAL | Status: DC
  Administered 2020-01-07: 1.5 mg via TRANSDERMAL

## 2020-01-07 MED ORDER — SCOPOLAMINE 1 MG/3DAYS TD PT72
MEDICATED_PATCH | TRANSDERMAL | Status: AC
  Filled 2020-01-07: qty 1

## 2020-01-07 MED ORDER — BUPIVACAINE HCL (PF) 0.25 % IJ SOLN: INTRAMUSCULAR | Status: DC | PRN

## 2020-01-07 MED ORDER — LACTATED RINGERS IV SOLN: INTRAVENOUS | Status: DC

## 2020-01-07 MED ORDER — LIDOCAINE 2% (20 MG/ML) 5 ML SYRINGE
INTRAMUSCULAR | Status: DC | PRN
  Administered 2020-01-07: 60 mg via INTRAVENOUS

## 2020-01-07 MED ORDER — BUPIVACAINE HCL (PF) 0.5 % IJ SOLN
INTRAMUSCULAR | Status: DC | PRN
  Administered 2020-01-07: 10 mL

## 2020-01-07 MED ORDER — BUPIVACAINE HCL (PF) 0.5 % IJ SOLN
INTRAMUSCULAR | Status: AC
  Filled 2020-01-07: qty 30

## 2020-01-07 MED ORDER — LIDOCAINE HCL (PF) 1 % IJ SOLN
INTRAMUSCULAR | Status: AC
  Filled 2020-01-07: qty 5

## 2020-01-07 MED ORDER — FENTANYL CITRATE (PF) 100 MCG/2ML IJ SOLN: 25.0000 ug | INTRAMUSCULAR | Status: DC | PRN

## 2020-01-07 MED ORDER — LIDOCAINE HCL 1 % IJ SOLN
INTRAMUSCULAR | Status: DC | PRN
  Administered 2020-01-07: 10 mL

## 2020-01-07 MED ORDER — PROMETHAZINE HCL 25 MG/ML IJ SOLN: 6.2500 mg | INTRAMUSCULAR | Status: DC | PRN

## 2020-01-07 MED FILL — HYDROCODON-APAP 5-325: 5-325 | 5 days supply | Qty: 20 | Fill #0

## 2020-01-07 SURGICAL SUPPLY — 50 items
BENZOIN TINCTURE PRP APPL 2/3 (GAUZE/BANDAGES/DRESSINGS) ×3 IMPLANT
BLADE SURG 15 STRL LF DISP TIS (BLADE) ×1 IMPLANT
BLADE SURG 15 STRL SS (BLADE) ×3
BNDG COHESIVE 1X5 TAN NS LF (GAUZE/BANDAGES/DRESSINGS) ×2 IMPLANT
BNDG CONFORM 2 STRL LF (GAUZE/BANDAGES/DRESSINGS) ×2 IMPLANT
BNDG ELASTIC 3X5.8 VLCR STR LF (GAUZE/BANDAGES/DRESSINGS) ×3 IMPLANT
BNDG ELASTIC 4X5.8 VLCR STR LF (GAUZE/BANDAGES/DRESSINGS) IMPLANT
BNDG ESMARK 4X9 LF (GAUZE/BANDAGES/DRESSINGS) IMPLANT
BNDG GAUZE 1X2.1 STRL (MISCELLANEOUS) IMPLANT
CLOSURE WOUND 1/2 X4 (GAUZE/BANDAGES/DRESSINGS)
CORD BIPOLAR FORCEPS 12FT (ELECTRODE) ×3 IMPLANT
COVER BACK TABLE 60X90IN (DRAPES) ×3 IMPLANT
COVER MAYO STAND STRL (DRAPES) ×3 IMPLANT
COVER WAND RF STERILE (DRAPES) IMPLANT
CUFF TOURN SGL QUICK 18X4 (TOURNIQUET CUFF) IMPLANT
DECANTER SPIKE VIAL GLASS SM (MISCELLANEOUS) IMPLANT
DRAPE EXTREMITY T 121X128X90 (DISPOSABLE) ×3 IMPLANT
DRAPE SURG 17X23 STRL (DRAPES) ×3 IMPLANT
DRSG EMULSION OIL 3X3 NADH (GAUZE/BANDAGES/DRESSINGS) ×3 IMPLANT
GAUZE SPONGE 4X4 12PLY STRL (GAUZE/BANDAGES/DRESSINGS) ×3 IMPLANT
GLOVE BIOGEL PI IND STRL 8.5 (GLOVE) ×1 IMPLANT
GLOVE BIOGEL PI INDICATOR 8.5 (GLOVE) ×2
GLOVE SURG ORTHO 8.0 STRL STRW (GLOVE) ×3 IMPLANT
GOWN STRL REUS W/ TWL LRG LVL3 (GOWN DISPOSABLE) ×1 IMPLANT
GOWN STRL REUS W/ TWL XL LVL3 (GOWN DISPOSABLE) ×1 IMPLANT
GOWN STRL REUS W/TWL LRG LVL3 (GOWN DISPOSABLE) ×3
GOWN STRL REUS W/TWL XL LVL3 (GOWN DISPOSABLE) ×3
NDL HYPO 25X1 1.5 SAFETY (NEEDLE) ×1 IMPLANT
NEEDLE HYPO 25X1 1.5 SAFETY (NEEDLE) ×3 IMPLANT
NS IRRIG 1000ML POUR BTL (IV SOLUTION) ×3 IMPLANT
PAD CAST 3X4 CTTN HI CHSV (CAST SUPPLIES) ×1 IMPLANT
PADDING CAST ABS 3INX4YD NS (CAST SUPPLIES) ×2
PADDING CAST ABS 4INX4YD NS (CAST SUPPLIES) ×2
PADDING CAST ABS COTTON 3X4 (CAST SUPPLIES) ×1 IMPLANT
PADDING CAST ABS COTTON 4X4 ST (CAST SUPPLIES) ×1 IMPLANT
PADDING CAST COTTON 3X4 STRL (CAST SUPPLIES) ×3
SET BASIN DAY SURGERY F.S. (CUSTOM PROCEDURE TRAY) ×3 IMPLANT
SPLINT PLASTER CAST XFAST 3X15 (CAST SUPPLIES) IMPLANT
SPLINT PLASTER XTRA FASTSET 3X (CAST SUPPLIES)
STOCKINETTE 4X48 STRL (DRAPES) ×3 IMPLANT
STRIP CLOSURE SKIN 1/2X4 (GAUZE/BANDAGES/DRESSINGS) IMPLANT
SUT MNCRL AB 3-0 PS2 18 (SUTURE) ×3 IMPLANT
SUT MNCRL AB 4-0 PS2 18 (SUTURE) IMPLANT
SUT PROLENE 4 0 PS 2 18 (SUTURE) IMPLANT
SUT PROLENE 5 0 PS 2 (SUTURE) ×4 IMPLANT
SYR BULB EAR ULCER 3OZ GRN STR (SYRINGE) ×3 IMPLANT
SYR CONTROL 10ML LL (SYRINGE) ×3 IMPLANT
TOWEL GREEN STERILE FF (TOWEL DISPOSABLE) ×3 IMPLANT
TRAY DSU PREP LF (CUSTOM PROCEDURE TRAY) ×3 IMPLANT
UNDERPAD 30X36 HEAVY ABSORB (UNDERPADS AND DIAPERS) ×3 IMPLANT

## 2020-01-07 NOTE — Anesthesia Postprocedure Evaluation (Signed)
Anesthesia Post Note  Patient: Stacey Jordan  Procedure(s) Performed: Right index finger and right long finger mass excision (Right Finger)     Patient location during evaluation: PACU Anesthesia Type: MAC Level of consciousness: awake and alert Pain management: pain level controlled Vital Signs Assessment: post-procedure vital signs reviewed and stable Respiratory status: spontaneous breathing and respiratory function stable Cardiovascular status: stable Postop Assessment: no apparent nausea or vomiting Anesthetic complications: no    Last Vitals:  Vitals:   01/07/20 1415 01/07/20 1430  BP: 113/71 129/72  Pulse: 62 63  Resp: 18 18  Temp:  36.6 C  SpO2: 100% 98%    Last Pain:  Vitals:   01/07/20 1430  TempSrc:   PainSc: 0-No pain                 [name] DANIEL

## 2020-01-07 NOTE — Op Note (Signed)
PREOPERATIVE DIAGNOSIS: Right index finger mass less than 1.5 cm deep mass Right long finger mass less than 1.5 cm, deep mass x2  POSTOPERATIVE DIAGNOSIS: Same  ATTENDING SURGEON: Dr. Iran Planas who scrubbed and present for the entire procedure  ASSISTANT SURGEON: Gertie Fey, PA-C was necessary for exposure mass removal closure is dressing application in a timely fashion  ANESTHESIA: Local with IV sedation  OPERATIVE PROCEDURE: #1: Right index finger mass removal of deep mass less than 1.5 cm #2: Right long finger mass removal less than 1.5 cm #3: Right long finger mass removal less than 1.5 cm   IMPLANTS: Same  RADIOGRAPHIC INTERPRETATION: None  SURGICAL INDICATIONS: Patient is a right-hand-dominant female with the persistent lesions over the volar aspect of the index and long finger.  Patient elected undergo the above procedure.  Risks of surgery include but not limited to bleeding infection damage nearby nerves arteries or tendons loss of motion of the wrist and digits incomplete relief of symptoms and need for further surgical intervention.  SURGICAL TECHNIQUE: Patient was palpated via the preoperative holding area marked for marker made on the right index and long finger to indicate correct operative site.  Patient brought back operating placed supine on anesthesia table where the IV sedation was administered.  Local anesthetic was administered.  1% Xylocaine 4% Marcaine local block with IV sedation.  Well-padded tourniquet was then placed on the right forearm and seal with appropriate drape.  The right upper extremities and prepped and draped in normal sterile fashion.  A timeout was called the correct site was then fine procedure then begun.  Attention was then turned to the long finger small Bruner incision made directly over the distal phalangeal region.  Dissection carried down through the skin and subcutaneous tissue down to the flexor sheath where the mass was then removed.   There was more than 1 mass both masses were circumferentially dissected through the same incision and 2 masses were removed from the long finger.  The neurovascular bundle was then carefully protected along the ulnar margin.  The wound was then thoroughly irrigated skin was then closed using simple Prolene sutures. Attention was then turned to the index finger where the similar incision was then made.  Dissection carried down through the skin and subcutaneous tissues down to the flexor sheath of the mass was then removed.  This was less than 1.5 cm.  There is only one mass to the index finger.  The wound was then thoroughly irrigated after careful protection and removal of the mass with retraction of the neurovascular bundles.  Skin was then closed using simple Prolene sutures.  Adaptic dressing sterile compressive bandage applied.  Patient tolerated procedure well.  POSTOPERATIVE PLAN: Patient discharged to home.  See him back in the office in 9 days for wound check likely suture removal go over the pathology gradual use and activity.  Surgical specimens: Right index finger mass right long finger mass x2 to pathology.

## 2020-01-07 NOTE — Transfer of Care (Signed)
Immediate Anesthesia Transfer of Care Note  Patient: Stacey Jordan  Procedure(s) Performed: Right index finger and right long finger mass excision (Right Finger)  Patient Location: PACU  Anesthesia Type:MAC  Level of Consciousness: awake  Airway & Oxygen Therapy: Patient Spontanous Breathing  Post-op Assessment: Report given to RN and Post -op Vital signs reviewed and stable  Post vital signs: Reviewed and stable  Last Vitals:  Vitals Value Taken Time  BP 113/82 01/07/20 1351  Temp    Pulse 72 01/07/20 1355  Resp 17 01/07/20 1355  SpO2 100 % 01/07/20 1355  Vitals shown include unvalidated device data.  Last Pain:  Vitals:   01/07/20 1204  TempSrc: Temporal  PainSc: 0-No pain      Patients Stated Pain Goal: 7 (22/44/97 [id number])  Complications: No apparent anesthesia complications

## 2020-01-07 NOTE — Discharge Instructions (Signed)
KEEP BANDAGE CLEAN AND DRY CALL OFFICE FOR F/U APPT [phone] in 09 days KEEP HAND ELEVATED ABOVE HEART rx sent to [facility] outpatient pharmacy vicodin CONTACT OFFICE IF ANY WORSENING PAIN OR CONCERNS.    Post Anesthesia Home Care Instructions  Activity: Get plenty of rest for the remainder of the day. A responsible individual must stay with you for 24 hours following the procedure.  For the next 24 hours, DO NOT: -Drive a car -Paediatric nurse -Drink alcoholic beverages -Take any medication unless instructed by your physician -Make any legal decisions or sign important papers.  Meals: Start with liquid foods such as gelatin or soup. Progress to regular foods as tolerated. Avoid greasy, spicy, heavy foods. If nausea and/or vomiting occur, drink only clear liquids until the nausea and/or vomiting subsides. Call your physician if vomiting continues.  Special Instructions/Symptoms: Your throat may feel dry or sore from the anesthesia or the breathing tube placed in your throat during surgery. If this causes discomfort, gargle with warm salt water. The discomfort should disappear within 24 hours.  If you had a scopolamine patch placed behind your ear for the management of post- operative nausea and/or vomiting:  1. The medication in the patch is effective for 72 hours, after which it should be removed.  Wrap patch in a tissue and discard in the trash. Wash hands thoroughly with soap and water. 2. You may remove the patch earlier than 72 hours if you experience unpleasant side effects which may include dry mouth, dizziness or visual disturbances. 3. Avoid touching the patch. Wash your hands with soap and water after contact with the patch.

## 2020-01-07 NOTE — Anesthesia Preprocedure Evaluation (Addendum)
Anesthesia Evaluation  Patient identified by MRN, date of birth, ID band Patient awake    Reviewed: Allergy & Precautions, NPO status , Patient's Chart, lab work & pertinent test results  History of Anesthesia Complications (+) PONV and history of anesthetic complications  Airway Mallampati: I  TM Distance: >3 FB Neck ROM: Full    Dental no notable dental hx. (+) Dental Advisory Given   Pulmonary neg pulmonary ROS,    Pulmonary exam normal        Cardiovascular negative cardio ROS Normal cardiovascular exam     Neuro/Psych negative neurological ROS     GI/Hepatic negative GI ROS, Neg liver ROS,   Endo/Other  negative endocrine ROS  Renal/GU negative Renal ROS     Musculoskeletal negative musculoskeletal ROS (+)   Abdominal   Peds  Hematology negative hematology ROS (+)   Anesthesia Other Findings Day of surgery medications reviewed with the patient.  Reproductive/Obstetrics                            Anesthesia Physical Anesthesia Plan  ASA: II  Anesthesia Plan: MAC   Post-op Pain Management:    Induction:   PONV Risk Score and Plan: Ondansetron, Propofol infusion and Midazolam  Airway Management Planned: Natural Airway and Simple Face Mask  Additional Equipment:   Intra-op Plan:   Post-operative Plan:   Informed Consent: I have reviewed the patients History and Physical, chart, labs and discussed the procedure including the risks, benefits and alternatives for the proposed anesthesia with the patient or authorized representative who has indicated his/her understanding and acceptance.     Dental advisory given  Plan Discussed with: Anesthesiologist and CRNA  Anesthesia Plan Comments:        Anesthesia Quick Evaluation

## 2020-01-08 LAB — SURGICAL PATHOLOGY

## 2020-01-08 MED FILL — ONDANSETRON HCL 4 MG TABS: 4 | 7 days supply | Qty: 28 | Fill #0

## 2020-01-09 ENCOUNTER — Encounter: Payer: Self-pay | Admitting: *Deleted

## 2020-02-18 ENCOUNTER — Other Ambulatory Visit: Payer: Self-pay

## 2020-02-18 NOTE — Progress Notes (Signed)
49 y.o. G61P2002 Married Caucasian female here for annual exam.    Notices some lumps on her labia.   She has some perineal bleeding with intercourse.  Not using Premarin cream.  Does not need refill on cream.   Occasional spotting.   No change in bladder function.  Still leaks with sneeze and cough.   PCP: Rachell Cipro, MD   No LMP recorded. (Menstrual status: IUD).           Sexually active: Yes.    The current method of family planning is IUD--Mirena 05-03-17.    Exercising: No.  The patient does not participate in regular exercise at present. Smoker:  no  Health Maintenance: Pap: 02-18-19 Neg:Neg HR HPV, 01-20-16 Neg:Neg HR HPV, 11-13-11  History of abnormal Pap:  yes MMG:  02/01/18 BIRADS 0 Incompleted; 02/07/18 [place] Left Breast - BIRADS 4:Suspicious; 02/08/18 MM Clip Placement/Breast Biopsy -- pathology revealed benign breast tissue with fibrocystic change at 11 o'clock. Colonoscopy:  NEVER BMD: n/a  Result  n/a TDaP:  01-20-16 Gardasil:   no HIV:  Neg in the past Hep C: Never Screening Labs:  today   reports that she has never smoked. She has never used smokeless tobacco. She reports current alcohol use of about 5.0 standard drinks of alcohol per week. She reports that she does not use drugs.  Past Medical History:  Diagnosis Date  . Complication of anesthesia    very sensitive to aneth and pain meds  . DDD (degenerative disc disease), lumbar   . Medical history non-contributory   . PONV (postoperative nausea and vomiting)     Past Surgical History:  Procedure Laterality Date  . BREAST BIOPSY Left 02/03/2016   U/S- Benign  . BREAST BIOPSY  04/24/2007   U/S Core- Benign  . BREAST EXCISIONAL BIOPSY Left 02/23/2016  . BREAST SURGERY  2005   Benign Rt.breast Bx  . EXCISION METACARPAL MASS Right 01/07/2020   Procedure: Right index finger and right long finger mass excision;  Surgeon: Iran Planas, MD;  Location: [place];  Service: Orthopedics;   Laterality: Right;  with local anesthesia  . HEMILAMINOTOMY LUMBAR SPINE Left 06/03/2008   L5-S1; with microdiscectomy and medial facetectomy  . MASS EXCISION Right 04/03/2013   Procedure:  MASS EXCISION AND BIOPSY;  Surgeon: Linna Hoff, MD;  Location: [place];  Service: Orthopedics;  Laterality: Right;  . RADIOACTIVE SEED GUIDED EXCISIONAL BREAST BIOPSY Left 02/24/2016   Procedure: RADIOACTIVE SEED GUIDED EXCISIONAL LEFT BREAST BIOPSY;  Surgeon: Rolm Bookbinder, MD;  Location: [place];  Service: General;  Laterality: Left;  RADIOACTIVE SEED GUIDED EXCISIONAL LEFT BREAST BIOPSY  . WISDOM TOOTH EXTRACTION      Current Outpatient Medications  Medication Sig Dispense Refill  . Cholecalciferol (VITAMIN D PO) Take by mouth daily.    Marland Kitchen conjugated estrogens (PREMARIN) vaginal cream Use 1/2 g vaginally every night at bed time for the first 2 weeks, then use 1/2 g vaginally two or three time per week. 30 g 1  . levonorgestrel (MIRENA) 20 MCG/24HR IUD 1 each by Intrauterine route once.    . vitamin B-12 (CYANOCOBALAMIN) 1000 MCG tablet Take 1,000 mcg by mouth daily.     No current facility-administered medications for this visit.    Family History  Problem Relation Age of Onset  . CAD Father   . Hypertension Father   . Hyperlipidemia Father   . Stroke Paternal Grandmother     Review of Systems  All other systems reviewed and are negative.   Exam:   BP 122/68   Pulse 76   Temp (!) 97.3 F (36.3 C) (Temporal)   Resp 14   Ht 5\' 3"  (1.6 m)   Wt 122 lb 9.6 oz (55.6 kg)   BMI 21.72 kg/m     General appearance: alert, cooperative and appears stated age Head: normocephalic, without obvious abnormality, atraumatic Neck: no adenopathy, supple, symmetrical, trachea midline and thyroid normal to inspection and palpation Lungs: clear to auscultation bilaterally Breasts: normal appearance, no masses or tenderness, No nipple retraction or dimpling, No  nipple discharge or bleeding, No axillary adenopathy Heart: regular rate and rhythm Abdomen: soft, non-tender; no masses, no organomegaly Extremities: extremities normal, atraumatic, no cyanosis or edema Skin: skin color, texture, turgor normal. No rashes or lesions Lymph nodes: cervical, supraclavicular, and axillary nodes normal. Neurologic: grossly normal  Pelvic: External genitalia:  Tiny sebaceous cysts of left labia minora.  Whitish change to the perineum in midline. Scar present.  Not raised.              No abnormal inguinal nodes palpated.              Urethra:  normal appearing urethra with no masses, tenderness or lesions              Bartholins and Skenes: normal                 Vagina: normal appearing vagina with normal color and discharge, no lesions              Cervix: no lesions.  No IUD strings noted.               Pap taken: No. Bimanual Exam:  Uterus:  normal size, contour, position, consistency, mobility, non-tender              Adnexa: no mass, fullness, tenderness              Rectal exam: Yes.  .  Confirms.              Anus:  normal sphincter tone, no lesions  Chaperone was present for exam.  Assessment:   Well woman visit with normal exam. Mirena IUD.   Threads not seen.  Status post left breast biopsy.  GSI.  Perineal thinning and pain/bleeding with intercourse.  Not using Premarin cream.   Plan: Mammogram screening discussed.  She will schedule.  Self breast awareness reviewed. Pap and HR HPV as above. Guidelines for Calcium, Vitamin D, regular exercise program including cardiovascular and weight bearing exercise. Restart Premarin cream.  Instructed in use.  Routine labs.  Return for pelvic US to check IUD position.  Use back up contraception.  Follow up annually and prn.   After visit summary provided.

## 2020-02-19 ENCOUNTER — Ambulatory Visit (INDEPENDENT_AMBULATORY_CARE_PROVIDER_SITE_OTHER): Payer: 59 | Admitting: Obstetrics and Gynecology

## 2020-02-19 ENCOUNTER — Encounter: Payer: Self-pay | Admitting: Obstetrics and Gynecology

## 2020-02-19 VITALS — BP 122/68 | HR 76 | Temp 97.3°F | Resp 14 | Ht 63.0 in | Wt 122.6 lb

## 2020-02-19 DIAGNOSIS — T8332XA Displacement of intrauterine contraceptive device, initial encounter: Secondary | ICD-10-CM

## 2020-02-19 DIAGNOSIS — Z01419 Encounter for gynecological examination (general) (routine) without abnormal findings: Secondary | ICD-10-CM | POA: Diagnosis not present

## 2020-02-19 NOTE — Patient Instructions (Signed)

## 2020-02-20 ENCOUNTER — Telehealth: Payer: Self-pay | Admitting: Obstetrics and Gynecology

## 2020-02-20 ENCOUNTER — Other Ambulatory Visit: Payer: Self-pay | Admitting: Obstetrics and Gynecology

## 2020-02-20 DIAGNOSIS — Z1231 Encounter for screening mammogram for malignant neoplasm of breast: Secondary | ICD-10-CM

## 2020-02-20 LAB — CBC
Hematocrit: 36.3 % (ref 34.0–46.6)
Hemoglobin: 12.3 g/dL (ref 11.1–15.9)
MCH: 30.8 pg (ref 26.6–33.0)
MCHC: 33.9 g/dL (ref 31.5–35.7)
MCV: 91 fL (ref 79–97)
Platelets: 273 10*3/uL (ref 150–450)
RBC: 4 x10E6/uL (ref 3.77–5.28)
RDW: 12.1 % (ref 11.7–15.4)
WBC: 9.6 10*3/uL (ref 3.4–10.8)

## 2020-02-20 LAB — COMPREHENSIVE METABOLIC PANEL
ALT: 12 IU/L (ref 0–32)
AST: 15 IU/L (ref 0–40)
Albumin/Globulin Ratio: 1.8 (ref 1.2–2.2)
Albumin: 4.3 g/dL (ref 3.8–4.8)
Alkaline Phosphatase: 48 IU/L (ref 48–121)
BUN/Creatinine Ratio: 14 (ref 9–23)
BUN: 13 mg/dL (ref 6–24)
Bilirubin Total: 0.3 mg/dL (ref 0.0–1.2)
CO2: 25 mmol/L (ref 20–29)
Calcium: 8.8 mg/dL (ref 8.7–10.2)
Chloride: 103 mmol/L (ref 96–106)
Creatinine, Ser: 0.93 mg/dL (ref 0.57–1.00)
GFR calc Af Amer: 84 mL/min/{1.73_m2} (ref >59)
GFR calc non Af Amer: 73 mL/min/{1.73_m2} (ref >59)
Globulin, Total: 2.4 g/dL (ref 1.5–4.5)
Glucose: 82 mg/dL (ref 65–99)
Potassium: 3.8 mmol/L (ref 3.5–5.2)
Sodium: 137 mmol/L (ref 134–144)
Total Protein: 6.7 g/dL (ref 6.0–8.5)

## 2020-02-20 LAB — LIPID PANEL
Chol/HDL Ratio: 3 ratio (ref 0.0–4.4)
Cholesterol, Total: 184 mg/dL (ref 100–199)
HDL: 61 mg/dL (ref >39)
LDL Chol Calc (NIH): 96 mg/dL (ref 0–99)
Triglycerides: 158 mg/dL — ABNORMAL HIGH (ref 0–149)
VLDL Cholesterol Cal: 27 mg/dL (ref 5–40)

## 2020-02-20 LAB — VITAMIN D 25 HYDROXY (VIT D DEFICIENCY, FRACTURES): Vit D, 25-Hydroxy: 24.7 ng/mL — ABNORMAL LOW (ref 30.0–100.0)

## 2020-02-20 LAB — TSH: TSH: 1.91 u[IU]/mL (ref 0.450–4.500)

## 2020-02-20 NOTE — Telephone Encounter (Signed)
Spoke with patient regarding benefits for recommended ultrasound. Patient is aware that ultrasound is transvaginal. Patient acknowledges understanding of information presented. Patient is aware of cancellation policy. Patient scheduled appointment for 02/26/2020 at 0900AM with Brook A. Quincy Simmonds, MD, Cherlynn June. Encounter closed.

## 2020-02-26 ENCOUNTER — Ambulatory Visit (INDEPENDENT_AMBULATORY_CARE_PROVIDER_SITE_OTHER): Payer: 59

## 2020-02-26 ENCOUNTER — Other Ambulatory Visit: Payer: 59 | Admitting: Obstetrics and Gynecology

## 2020-02-26 ENCOUNTER — Ambulatory Visit (INDEPENDENT_AMBULATORY_CARE_PROVIDER_SITE_OTHER): Payer: 59 | Admitting: Obstetrics and Gynecology

## 2020-02-26 ENCOUNTER — Other Ambulatory Visit: Payer: Self-pay

## 2020-02-26 ENCOUNTER — Encounter: Payer: Self-pay | Admitting: Obstetrics and Gynecology

## 2020-02-26 ENCOUNTER — Other Ambulatory Visit: Payer: 59

## 2020-02-26 VITALS — BP 110/72 | HR 66 | Temp 97.3°F | Ht 63.25 in | Wt 122.0 lb

## 2020-02-26 DIAGNOSIS — R7989 Other specified abnormal findings of blood chemistry: Secondary | ICD-10-CM | POA: Diagnosis not present

## 2020-02-26 DIAGNOSIS — T8332XA Displacement of intrauterine contraceptive device, initial encounter: Secondary | ICD-10-CM | POA: Diagnosis not present

## 2020-02-26 DIAGNOSIS — T8332XD Displacement of intrauterine contraceptive device, subsequent encounter: Secondary | ICD-10-CM

## 2020-02-26 DIAGNOSIS — Z30431 Encounter for routine checking of intrauterine contraceptive device: Secondary | ICD-10-CM | POA: Diagnosis not present

## 2020-02-26 NOTE — Progress Notes (Signed)
GYNECOLOGY  VISIT   HPI: 49 y.o.   Married  Caucasian  female   G2P2002 with No LMP recorded. (Menstrual status: IUD).   here for pelvic ultrasound to check IUD position.  Asking about menopause and Mirena IUD.   Has appt for a mammogram.   Asking to review her labs from her annual exam.   GYNECOLOGIC HISTORY: No LMP recorded. (Menstrual status: IUD). Contraception: Mirena IUD 05-03-17 Menopausal hormone therapy: Premarin cream Last mammogram:   02/01/18 BIRADS 0 Incompleted; 02/07/18 [place] Left Breast - BIRADS 4:Suspicious; 02/08/18 MM Clip Placement/Breast Biopsy -- pathology revealed benign breast tissue with fibrocystic change at 11 o'clock.-- Appt 03-23-20 Last pap smear: 02-18-19 Neg:Neg HR HPV, 01-20-16 Neg:Neg HR HPV, 11-13-11         OB History    Gravida  2   Para  2   Term  2   Preterm      AB      Living  2     SAB      TAB      Ectopic      Multiple      Live Births                 Patient Active Problem List   Diagnosis Date Noted  . Right shoulder pain 07/07/2016  . Breast mass, right 02/14/2016  . Elbow pain, chronic 05/20/2013  . Lateral epicondylitis of right elbow 05/20/2013    Past Medical History:  Diagnosis Date  . Complication of anesthesia    very sensitive to aneth and pain meds  . DDD (degenerative disc disease), lumbar   . Medical history non-contributory   . PONV (postoperative nausea and vomiting)     Past Surgical History:  Procedure Laterality Date  . BREAST BIOPSY Left 02/03/2016   U/S- Benign  . BREAST BIOPSY  04/24/2007   U/S Core- Benign  . BREAST EXCISIONAL BIOPSY Left 02/23/2016  . BREAST SURGERY  2005   Benign Rt.breast Bx  . EXCISION METACARPAL MASS Right 01/07/2020   Procedure: Right index finger and right long finger mass excision;  Surgeon: Iran Planas, MD;  Location: [facility];  Service: Orthopedics;  Laterality: Right;  with local anesthesia  . HEMILAMINOTOMY LUMBAR SPINE Left 06/03/2008    L5-S1; with microdiscectomy and medial facetectomy  . MASS EXCISION Right 04/03/2013   Procedure:  MASS EXCISION AND BIOPSY;  Surgeon: Linna Hoff, MD;  Location: [facility];  Service: Orthopedics;  Laterality: Right;  . RADIOACTIVE SEED GUIDED EXCISIONAL BREAST BIOPSY Left 02/24/2016   Procedure: RADIOACTIVE SEED GUIDED EXCISIONAL LEFT BREAST BIOPSY;  Surgeon: Rolm Bookbinder, MD;  Location: [facility];  Service: General;  Laterality: Left;  RADIOACTIVE SEED GUIDED EXCISIONAL LEFT BREAST BIOPSY  . WISDOM TOOTH EXTRACTION      Current Outpatient Medications  Medication Sig Dispense Refill  . Cholecalciferol (VITAMIN D PO) Take by mouth daily.    Marland Kitchen conjugated estrogens (PREMARIN) vaginal cream Use 1/2 g vaginally every night at bed time for the first 2 weeks, then use 1/2 g vaginally two or three time per week. 30 g 1  . levonorgestrel (MIRENA) 20 MCG/24HR IUD 1 each by Intrauterine route once.    . vitamin B-12 (CYANOCOBALAMIN) 1000 MCG tablet Take 1,000 mcg by mouth daily.     No current facility-administered medications for this visit.     ALLERGIES: Patient has no known allergies.  Family History  Problem Relation Age of  Onset  . CAD Father   . Hypertension Father   . Hyperlipidemia Father   . Stroke Paternal Grandmother     Social History   Socioeconomic History  . Marital status: Married    Spouse name: Not on file  . Number of children: Not on file  . Years of education: Not on file  . Highest education level: Not on file  Occupational History  . Not on file  Tobacco Use  . Smoking status: Never Smoker  . Smokeless tobacco: Never Used  Vaping Use  . Vaping Use: Never used  Substance and Sexual Activity  . Alcohol use: Yes    Alcohol/week: 5.0 standard drinks    Types: 5 Standard drinks or equivalent per week    Comment: 5 glassed wine/beer/week  . Drug use: No  . Sexual activity: Yes    Partners: Male    Birth  control/protection: I.U.D.    Comment: Mirena IUD inserted 2018  Other Topics Concern  . Not on file  Social History Narrative  . Not on file   Social Determinants of Health   Financial Resource Strain:   . Difficulty of Paying Living Expenses:   Food Insecurity:   . Worried About Charity fundraiser in the Last Year:   . Arboriculturist in the Last Year:   Transportation Needs:   . Film/video editor (Medical):   Marland Kitchen Lack of Transportation (Non-Medical):   Physical Activity:   . Days of Exercise per Week:   . Minutes of Exercise per Session:   Stress:   . Feeling of Stress :   Social Connections:   . Frequency of Communication with Friends and Family:   . Frequency of Social Gatherings with Friends and Family:   . Attends Religious Services:   . Active Member of Clubs or Organizations:   . Attends Archivist Meetings:   Marland Kitchen Marital Status:   Intimate Partner Violence:   . Fear of Current or Ex-Partner:   . Emotionally Abused:   Marland Kitchen Physically Abused:   . Sexually Abused:     Review of Systems  All other systems reviewed and are negative.   PHYSICAL EXAMINATION:    BP 110/72   Pulse 66   Temp (!) 97.3 F (36.3 C) (Temporal)   Ht 5' 3.25" (1.607 m)   Wt 122 lb (55.3 kg)   BMI 21.44 kg/m     General appearance: alert, cooperative and appears stated age  Pelvic US Uterus no myometrial masses.  IUD in normal position, strings close to ext os.  Normal ovaries.  No adnexal masses.  No free fluid.   ASSESSMENT   IUD check up.  Lost IUD threads.  Low vit D.  Mildly elevated triglycerides.   PLAN  We discussed multiple benefits of Mirena IUD.  Increase vit D to 1000 IU daily.  We talked about dietary sources of calcium and vit D.  Fu for annual exam and prn.

## 2020-03-23 ENCOUNTER — Ambulatory Visit
Admission: RE | Admit: 2020-03-23 | Discharge: 2020-03-23 | Disposition: A | Discharge status: Home / Self Care | Payer: 59 | Source: Ambulatory Visit | Attending: Obstetrics and Gynecology | Admitting: Obstetrics and Gynecology

## 2020-03-23 ENCOUNTER — Other Ambulatory Visit: Payer: Self-pay

## 2020-03-23 DIAGNOSIS — Z1231 Encounter for screening mammogram for malignant neoplasm of breast: Secondary | ICD-10-CM

## 2020-07-20 DIAGNOSIS — H524 Presbyopia: Secondary | ICD-10-CM | POA: Diagnosis not present

## 2020-11-22 ENCOUNTER — Other Ambulatory Visit (HOSPITAL_COMMUNITY): Payer: Self-pay | Admitting: Pharmacist

## 2020-11-22 MED FILL — CARESTART COVID-19 HOME TES: 4 days supply | Qty: 4 | Fill #0

## 2021-01-02 DIAGNOSIS — Z8616 Personal history of COVID-19: Secondary | ICD-10-CM

## 2021-01-02 HISTORY — DX: Personal history of COVID-19: Z86.16

## 2021-01-21 ENCOUNTER — Other Ambulatory Visit (HOSPITAL_COMMUNITY): Payer: Self-pay

## 2021-01-21 MED ORDER — CARESTART COVID-19 HOME TEST VI KIT
PACK | 0 refills | Status: DC
  Filled 2021-01-21: qty 4, 4d supply, fill #0

## 2021-02-17 NOTE — Progress Notes (Deleted)
50 y.o. G41P2002 Married Caucasian female here for annual exam.    PCP:     No LMP recorded. (Menstrual status: IUD).           Sexually active: {yes no:314532}  The current method of family planning is ***IUD--Mirena 04/23/17.    Exercising: {yes no:314532}  {types:19826} Smoker:  no  Health Maintenance: Pap: 02-18-19 Neg:Neg HR HPV, 01-20-16 Neg:Neg HR HPV History of abnormal Pap:  {YES NO:22349} MMG: 03-23-20 3D/Neg/BiRads1 Colonoscopy:  *** BMD:   n/a  Result  n/a TDaP:  01-20-16 Gardasil:   no HIV:Neg in past Hep C:Never Screening Labs:  Hb today: ***, Urine today: ***   reports that she has never smoked. She has never used smokeless tobacco. She reports current alcohol use of about 5.0 standard drinks of alcohol per week. She reports that she does not use drugs.  Past Medical History:  Diagnosis Date   Complication of anesthesia    very sensitive to aneth and pain meds   DDD (degenerative disc disease), lumbar    Medical history non-contributory    PONV (postoperative nausea and vomiting)     Past Surgical History:  Procedure Laterality Date   BREAST BIOPSY Left 02/03/2016   U/S- Benign   BREAST BIOPSY  04/24/2007   U/S Core- Benign   BREAST CYST ASPIRATION Left    BREAST EXCISIONAL BIOPSY Left 02/23/2016   benign   BREAST SURGERY  2005   Benign Rt.breast Bx   EXCISION METACARPAL MASS Right 01/07/2020   Procedure: Right index finger and right long finger mass excision;  Surgeon: Iran Planas, MD;  Location: [facility];  Service: Orthopedics;  Laterality: Right;  with local anesthesia   HEMILAMINOTOMY LUMBAR SPINE Left 06/03/2008   L5-S1; with microdiscectomy and medial facetectomy   MASS EXCISION Right 04/03/2013   Procedure:  MASS EXCISION AND BIOPSY;  Surgeon: Linna Hoff, MD;  Location: [facility];  Service: Orthopedics;  Laterality: Right;   RADIOACTIVE SEED GUIDED EXCISIONAL BREAST BIOPSY Left 02/24/2016   Procedure: RADIOACTIVE  SEED GUIDED EXCISIONAL LEFT BREAST BIOPSY;  Surgeon: Rolm Bookbinder, MD;  Location: [facility];  Service: General;  Laterality: Left;  RADIOACTIVE SEED GUIDED EXCISIONAL LEFT BREAST BIOPSY   WISDOM TOOTH EXTRACTION      Current Outpatient Medications  Medication Sig Dispense Refill   Cholecalciferol (VITAMIN D PO) Take by mouth daily.     conjugated estrogens (PREMARIN) vaginal cream Use 1/2 g vaginally every night at bed time for the first 2 weeks, then use 1/2 g vaginally two or three time per week. 30 g 1   COVID-19 At Home Antigen Test (CARESTART COVID-19 HOME TEST) KIT Use as directed 4 each 0   COVID-19 At Home Antigen Test KIT USE AS DIRECTED 4 each 0   levonorgestrel (MIRENA) 20 MCG/24HR IUD 1 each by Intrauterine route once.     vitamin B-12 (CYANOCOBALAMIN) 1000 MCG tablet Take 1,000 mcg by mouth daily.     No current facility-administered medications for this visit.    Family History  Problem Relation Age of Onset   CAD Father    Hypertension Father    Hyperlipidemia Father    Stroke Paternal Grandmother     Review of Systems  Exam:   There were no vitals taken for this visit.    General appearance: alert, cooperative and appears stated age Head: normocephalic, without obvious abnormality, atraumatic Neck: no adenopathy, supple, symmetrical, trachea midline and thyroid normal to  inspection and palpation Lungs: clear to auscultation bilaterally Breasts: normal appearance, no masses or tenderness, No nipple retraction or dimpling, No nipple discharge or bleeding, No axillary adenopathy Heart: regular rate and rhythm Abdomen: soft, non-tender; no masses, no organomegaly Extremities: extremities normal, atraumatic, no cyanosis or edema Skin: skin color, texture, turgor normal. No rashes or lesions Lymph nodes: cervical, supraclavicular, and axillary nodes normal. Neurologic: grossly normal  Pelvic: External genitalia:  no lesions              No  abnormal inguinal nodes palpated.              Urethra:  normal appearing urethra with no masses, tenderness or lesions              Bartholins and Skenes: normal                 Vagina: normal appearing vagina with normal color and discharge, no lesions              Cervix: no lesions              Pap taken: {yes no:314532} Bimanual Exam:  Uterus:  normal size, contour, position, consistency, mobility, non-tender              Adnexa: no mass, fullness, tenderness              Rectal exam: {yes no:314532}.  Confirms.              Anus:  normal sphincter tone, no lesions  Chaperone was present for exam.  Assessment:   Well woman visit with normal exam.   Plan: Mammogram screening discussed. Self breast awareness reviewed. Pap and HR HPV as above. Guidelines for Calcium, Vitamin D, regular exercise program including cardiovascular and weight bearing exercise.   Follow up annually and prn.   Additional counseling given.  {yes [id number]. _______ minutes face to face time of which over 50% was spent in counseling.    After visit summary provided.

## 2021-02-21 ENCOUNTER — Ambulatory Visit: Payer: 59 | Admitting: Obstetrics and Gynecology

## 2021-02-23 ENCOUNTER — Other Ambulatory Visit (HOSPITAL_COMMUNITY): Payer: Self-pay

## 2021-02-23 ENCOUNTER — Encounter: Payer: Self-pay | Admitting: Obstetrics and Gynecology

## 2021-02-23 ENCOUNTER — Other Ambulatory Visit: Payer: Self-pay

## 2021-02-23 ENCOUNTER — Ambulatory Visit (INDEPENDENT_AMBULATORY_CARE_PROVIDER_SITE_OTHER): Payer: 59 | Admitting: Obstetrics and Gynecology

## 2021-02-23 VITALS — BP 100/58 | HR 80 | Ht 63.0 in | Wt 121.0 lb

## 2021-02-23 DIAGNOSIS — Z01419 Encounter for gynecological examination (general) (routine) without abnormal findings: Secondary | ICD-10-CM | POA: Diagnosis not present

## 2021-02-23 MED ORDER — PREMARIN 0.625 MG/GM VA CREA
TOPICAL_CREAM | VAGINAL | 1 refills | Status: DC
  Filled 2021-02-23: qty 30, 90d supply, fill #0

## 2021-02-23 NOTE — Patient Instructions (Signed)

## 2021-02-23 NOTE — Progress Notes (Signed)
50 y.o. G56P2002 Married Caucasian female here for annual exam.    Wants to start taking vitamins and exercising regularly.   Had pelvic US 02/26/20 for lost IUD threads.  IUD in normal position in endometrial canal.   Not using the vaginal estrogen cream regularly.  Some bleeding with sex.  Has vulvar thinning.   Leaking urine with sneeze and cough 75% of time.  She will let me know if she wants to pursue PT.   PCP:   Rachell Cipro, MD  No LMP recorded. (Menstrual status: IUD).     Period Cycle (Days):  (no cycle with Mirena IUD)     Sexually active: Yes.    The current method of family planning is IUD Mirena 05/03/17.    Exercising: No.  The patient does not participate in regular exercise at present. Smoker:  no  Health Maintenance: Pap:  02-18-19 Neg:Neg HR HPV, 01-20-16 Neg:Neg HR HPV History of abnormal Pap:  no MMG: 03-23-20 3D/Neg/BiRads1.  She will schedule.  Colonoscopy:  no BMD:   n/a  Result  n/a TDaP:  01-20-16 Gardasil:   no HIV:neg in past Hep C:Never Screening Labs:  today.   reports that she has never smoked. She has never used smokeless tobacco. She reports current alcohol use of about 5.0 standard drinks of alcohol per week. She reports that she does not use drugs.  Past Medical History:  Diagnosis Date   Complication of anesthesia    very sensitive to aneth and pain meds   DDD (degenerative disc disease), lumbar    History of COVID-19 01/02/2021   Medical history non-contributory    PONV (postoperative nausea and vomiting)     Past Surgical History:  Procedure Laterality Date   BREAST BIOPSY Left 02/03/2016   U/S- Benign   BREAST BIOPSY  04/24/2007   U/S Core- Benign   BREAST CYST ASPIRATION Left    BREAST EXCISIONAL BIOPSY Left 02/23/2016   benign   BREAST SURGERY  2005   Benign Rt.breast Bx   EXCISION METACARPAL MASS Right 01/07/2020   Procedure: Right index finger and right long finger mass excision;  Surgeon: Iran Planas, MD;  Location:  [facility];  Service: Orthopedics;  Laterality: Right;  with local anesthesia   HEMILAMINOTOMY LUMBAR SPINE Left 06/03/2008   L5-S1; with microdiscectomy and medial facetectomy   MASS EXCISION Right 04/03/2013   Procedure:  MASS EXCISION AND BIOPSY;  Surgeon: Linna Hoff, MD;  Location: [facility];  Service: Orthopedics;  Laterality: Right;   RADIOACTIVE SEED GUIDED EXCISIONAL BREAST BIOPSY Left 02/24/2016   Procedure: RADIOACTIVE SEED GUIDED EXCISIONAL LEFT BREAST BIOPSY;  Surgeon: Rolm Bookbinder, MD;  Location: [facility];  Service: General;  Laterality: Left;  RADIOACTIVE SEED GUIDED EXCISIONAL LEFT BREAST BIOPSY   WISDOM TOOTH EXTRACTION      Current Outpatient Medications  Medication Sig Dispense Refill   levonorgestrel (MIRENA) 20 MCG/24HR IUD 1 each by Intrauterine route once.     vitamin B-12 (CYANOCOBALAMIN) 1000 MCG tablet Take 1,000 mcg by mouth daily.     conjugated estrogens (PREMARIN) vaginal cream Use 1/2 g vaginally every night at bed time for the first 2 weeks, then use 1/2 g vaginally two or three time per week. (Patient not taking: Reported on 02/23/2021) 30 g 1   No current facility-administered medications for this visit.    Family History  Problem Relation Age of Onset   CAD Father    Hypertension Father  Hyperlipidemia Father    Stroke Paternal Grandmother     Review of Systems  See HPI.  Exam:   BP (!) 100/58   Pulse 80   Ht 5\' 3"  (1.6 m)   Wt 121 lb (54.9 kg)   SpO2 100%   BMI 21.43 kg/m     General appearance: alert, cooperative and appears stated age Head: normocephalic, without obvious abnormality, atraumatic Neck: no adenopathy, supple, symmetrical, trachea midline and thyroid normal to inspection and palpation Lungs: clear to auscultation bilaterally Breasts: normal appearance, no masses or tenderness, No nipple retraction or dimpling, No nipple discharge or bleeding, No axillary adenopathy Heart:  regular rate and rhythm Abdomen: soft, non-tender; no masses, no organomegaly Extremities: extremities normal, atraumatic, no cyanosis or edema Skin: skin color, texture, turgor normal. No rashes or lesions Lymph nodes: cervical, supraclavicular, and axillary nodes normal. Neurologic: grossly normal  Pelvic: External genitalia:  no lesions              No abnormal inguinal nodes palpated.              Urethra:  normal appearing urethra with no masses, tenderness or lesions              Bartholins and Skenes: normal                 Vagina: normal appearing vagina with normal color and discharge, no lesions              Cervix: no lesions. IUD threads seen.               Pap taken: no Bimanual Exam:  Uterus:  normal size, contour, position, consistency, mobility, non-tender              Adnexa: no mass, fullness, tenderness              Rectal exam: yes  Confirms.              Anus:  normal sphincter tone, no lesions  Chaperone was present for exam.  Assessment:   Well woman visit with normal exam. Mirena IUD.  Threads seen today.  Removal due in 2025. Vulvar atrophy. Status post left breast biopsy. GSI.   Plan: Mammogram screening discussed.  She will update.  Pap and HR HPV 2025. Guidelines for Calcium, Vitamin D, regular exercise program including cardiovascular and weight bearing exercise. Routine labs.  Refill of Premarin cream. Follow up annually and prn.   After visit summary provided.

## 2021-02-24 ENCOUNTER — Encounter: Payer: Self-pay | Admitting: Obstetrics and Gynecology

## 2021-02-24 LAB — COMPREHENSIVE METABOLIC PANEL
AG Ratio: 1.8 (calc) (ref 1.0–2.5)
ALT: 10 U/L (ref 6–29)
AST: 18 U/L (ref 10–35)
Albumin: 4.4 g/dL (ref 3.6–5.1)
Alkaline phosphatase (APISO): 43 U/L (ref 31–125)
BUN: 16 mg/dL (ref 7–25)
CO2: 26 mmol/L (ref 20–32)
Calcium: 9 mg/dL (ref 8.6–10.2)
Chloride: 105 mmol/L (ref 98–110)
Creat: 1.07 mg/dL (ref 0.50–1.10)
Globulin: 2.4 g/dL (calc) (ref 1.9–3.7)
Glucose, Bld: 75 mg/dL (ref 65–99)
Potassium: 3.8 mmol/L (ref 3.5–5.3)
Sodium: 137 mmol/L (ref 135–146)
Total Bilirubin: 0.6 mg/dL (ref 0.2–1.2)
Total Protein: 6.8 g/dL (ref 6.1–8.1)

## 2021-02-24 LAB — LIPID PANEL
Cholesterol: 209 mg/dL — ABNORMAL HIGH (ref <200)
HDL: 79 mg/dL (ref >=50)
LDL Cholesterol (Calc): 106 mg/dL (calc) — ABNORMAL HIGH
Non-HDL Cholesterol (Calc): 130 mg/dL (calc) — ABNORMAL HIGH (ref <130)
Total CHOL/HDL Ratio: 2.6 (calc) (ref <5.0)
Triglycerides: 128 mg/dL (ref <150)

## 2021-02-24 LAB — CBC
HCT: 39 % (ref 35.0–45.0)
Hemoglobin: 12.8 g/dL (ref 11.7–15.5)
MCH: 30.2 pg (ref 27.0–33.0)
MCHC: 32.8 g/dL (ref 32.0–36.0)
MCV: 92 fL (ref 80.0–100.0)
MPV: 10.1 fL (ref 7.5–12.5)
Platelets: 248 10*3/uL (ref 140–400)
RBC: 4.24 10*6/uL (ref 3.80–5.10)
RDW: 12.3 % (ref 11.0–15.0)
WBC: 6.5 10*3/uL (ref 3.8–10.8)

## 2021-02-24 LAB — VITAMIN D 25 HYDROXY (VIT D DEFICIENCY, FRACTURES): Vit D, 25-Hydroxy: 28 ng/mL — ABNORMAL LOW (ref 30–100)

## 2021-03-02 ENCOUNTER — Other Ambulatory Visit: Payer: Self-pay | Admitting: Obstetrics and Gynecology

## 2021-03-02 DIAGNOSIS — Z1231 Encounter for screening mammogram for malignant neoplasm of breast: Secondary | ICD-10-CM

## 2021-03-04 ENCOUNTER — Other Ambulatory Visit (HOSPITAL_COMMUNITY): Payer: Self-pay

## 2021-04-08 ENCOUNTER — Other Ambulatory Visit: Payer: Self-pay

## 2021-04-25 ENCOUNTER — Ambulatory Visit
Admission: RE | Admit: 2021-04-25 | Discharge: 2021-04-25 | Disposition: A | Discharge status: Home / Self Care | Payer: 59 | Source: Ambulatory Visit | Attending: Obstetrics and Gynecology | Admitting: Obstetrics and Gynecology

## 2021-04-25 ENCOUNTER — Other Ambulatory Visit: Payer: Self-pay

## 2021-04-25 DIAGNOSIS — Z1231 Encounter for screening mammogram for malignant neoplasm of breast: Secondary | ICD-10-CM

## 2021-06-03 ENCOUNTER — Ambulatory Visit: Payer: 59 | Admitting: Obstetrics and Gynecology

## 2021-07-01 ENCOUNTER — Ambulatory Visit: Payer: 59

## 2021-08-24 ENCOUNTER — Other Ambulatory Visit (HOSPITAL_COMMUNITY): Payer: Self-pay

## 2021-09-05 ENCOUNTER — Other Ambulatory Visit (HOSPITAL_COMMUNITY): Payer: Self-pay

## 2021-09-05 MED ORDER — CARESTART COVID-19 HOME TEST VI KIT
PACK | 0 refills | Status: DC
  Filled 2021-09-05: qty 4, 4d supply, fill #0

## 2021-10-21 ENCOUNTER — Other Ambulatory Visit (HOSPITAL_COMMUNITY): Payer: Self-pay

## 2021-11-05 ENCOUNTER — Other Ambulatory Visit (HOSPITAL_COMMUNITY): Payer: Self-pay

## 2021-11-05 MED ORDER — COVID-19 AT HOME ANTIGEN TEST VI KIT
PACK | 0 refills | Status: DC
  Filled 2021-11-05: qty 4, 30d supply, fill #0
  Filled 2021-11-21: qty 2, 4d supply, fill #0

## 2021-11-15 ENCOUNTER — Other Ambulatory Visit (HOSPITAL_COMMUNITY): Payer: Self-pay

## 2021-11-21 ENCOUNTER — Other Ambulatory Visit (HOSPITAL_COMMUNITY): Payer: Self-pay

## 2022-01-12 ENCOUNTER — Other Ambulatory Visit (HOSPITAL_COMMUNITY): Payer: Self-pay

## 2022-01-12 DIAGNOSIS — M5416 Radiculopathy, lumbar region: Secondary | ICD-10-CM | POA: Diagnosis not present

## 2022-01-12 MED ORDER — METHYLPREDNISOLONE 4 MG PO TBPK
ORAL_TABLET | ORAL | 0 refills | Status: DC
  Filled 2022-01-12: qty 21, 6d supply, fill #0

## 2022-02-17 ENCOUNTER — Other Ambulatory Visit (HOSPITAL_COMMUNITY): Payer: Self-pay

## 2022-02-23 NOTE — Progress Notes (Unsigned)
51 y.o. G14P2002 Married Caucasian female here for annual exam.    PCP: Rachell Cipro, MD    No LMP recorded. (Menstrual status: IUD).           Sexually active: {yes no:314532}  The current method of family planning is IUD--Mirena 05/03/17.    Exercising: {yes no:314532}  {types:19826} Smoker:  no  Health Maintenance: Pap:   02-18-19 Neg:Neg HR HPV, 01-20-16 Neg:Neg HR HPV History of abnormal Pap:  no MMG:  04-25-21 Neg/Birads1 Colonoscopy:  ***NEVER BMD:   n/a  Result  n/a TDaP:  01-20-16 Gardasil:   no HIV:  Neg in the past Hep C: Neg Screening Labs:  Hb today: ***, Urine today: ***   reports that she has never smoked. She has never used smokeless tobacco. She reports current alcohol use of about 5.0 standard drinks of alcohol per week. She reports that she does not use drugs.  Past Medical History:  Diagnosis Date   Complication of anesthesia    very sensitive to aneth and pain meds   DDD (degenerative disc disease), lumbar    History of COVID-19 01/02/2021   Medical history non-contributory    PONV (postoperative nausea and vomiting)     Past Surgical History:  Procedure Laterality Date   BREAST BIOPSY Left 02/03/2016   U/S- Benign   BREAST BIOPSY  04/24/2007   U/S Core- Benign   BREAST CYST ASPIRATION Left    BREAST EXCISIONAL BIOPSY Left 02/23/2016   benign   BREAST SURGERY  2005   Benign Rt.breast Bx   EXCISION METACARPAL MASS Right 01/07/2020   Procedure: Right index finger and right long finger mass excision;  Surgeon: Iran Planas, MD;  Location: [facility];  Service: Orthopedics;  Laterality: Right;  with local anesthesia   HEMILAMINOTOMY LUMBAR SPINE Left 06/03/2008   L5-S1; with microdiscectomy and medial facetectomy   MASS EXCISION Right 04/03/2013   Procedure:  MASS EXCISION AND BIOPSY;  Surgeon: Linna Hoff, MD;  Location: [facility];  Service: Orthopedics;  Laterality: Right;   RADIOACTIVE SEED GUIDED EXCISIONAL BREAST  BIOPSY Left 02/24/2016   Procedure: RADIOACTIVE SEED GUIDED EXCISIONAL LEFT BREAST BIOPSY;  Surgeon: Rolm Bookbinder, MD;  Location: [facility];  Service: General;  Laterality: Left;  RADIOACTIVE SEED GUIDED EXCISIONAL LEFT BREAST BIOPSY   WISDOM TOOTH EXTRACTION      Current Outpatient Medications  Medication Sig Dispense Refill   conjugated estrogens (PREMARIN) vaginal cream Use 1/2 g vaginally every night at bed time for the first 2 weeks, then use 1/2 g vaginally two or three times per week. 30 g 1   COVID-19 At Home Antigen Test KIT Use as directed per package instructions 2 each 0   levonorgestrel (MIRENA) 20 MCG/24HR IUD 1 each by Intrauterine route once.     methylPREDNISolone (MEDROL DOSEPAK) 4 MG TBPK tablet Take as directed 21 tablet 0   vitamin B-12 (CYANOCOBALAMIN) 1000 MCG tablet Take 1,000 mcg by mouth daily.     No current facility-administered medications for this visit.    Family History  Problem Relation Age of Onset   CAD Father    Hypertension Father    Hyperlipidemia Father    Stroke Paternal Grandmother     Review of Systems  Exam:   There were no vitals taken for this visit.    General appearance: alert, cooperative and appears stated age Head: normocephalic, without obvious abnormality, atraumatic Neck: no adenopathy, supple, symmetrical, trachea midline and thyroid  normal to inspection and palpation Lungs: clear to auscultation bilaterally Breasts: normal appearance, no masses or tenderness, No nipple retraction or dimpling, No nipple discharge or bleeding, No axillary adenopathy Heart: regular rate and rhythm Abdomen: soft, non-tender; no masses, no organomegaly Extremities: extremities normal, atraumatic, no cyanosis or edema Skin: skin color, texture, turgor normal. No rashes or lesions Lymph nodes: cervical, supraclavicular, and axillary nodes normal. Neurologic: grossly normal  Pelvic: External genitalia:  no lesions               No abnormal inguinal nodes palpated.              Urethra:  normal appearing urethra with no masses, tenderness or lesions              Bartholins and Skenes: normal                 Vagina: normal appearing vagina with normal color and discharge, no lesions              Cervix: no lesions              Pap taken: {yes no:314532} Bimanual Exam:  Uterus:  normal size, contour, position, consistency, mobility, non-tender              Adnexa: no mass, fullness, tenderness              Rectal exam: {yes no:314532}.  Confirms.              Anus:  normal sphincter tone, no lesions  Chaperone was present for exam:  ***  Assessment:   Well woman visit with gynecologic exam.   Plan: Mammogram screening discussed. Self breast awareness reviewed. Pap and HR HPV as above. Guidelines for Calcium, Vitamin D, regular exercise program including cardiovascular and weight bearing exercise.   Follow up annually and prn.   Additional counseling given.  {yes [id number]. _______ minutes face to face time of which over 50% was spent in counseling.    After visit summary provided.

## 2022-02-27 ENCOUNTER — Ambulatory Visit (INDEPENDENT_AMBULATORY_CARE_PROVIDER_SITE_OTHER): Payer: 59 | Admitting: Obstetrics and Gynecology

## 2022-02-27 VITALS — BP 112/70 | HR 81 | Ht 63.25 in | Wt 118.0 lb

## 2022-02-27 DIAGNOSIS — Z1211 Encounter for screening for malignant neoplasm of colon: Secondary | ICD-10-CM

## 2022-02-27 DIAGNOSIS — N951 Menopausal and female climacteric states: Secondary | ICD-10-CM

## 2022-02-27 DIAGNOSIS — Z01419 Encounter for gynecological examination (general) (routine) without abnormal findings: Secondary | ICD-10-CM | POA: Diagnosis not present

## 2022-02-28 ENCOUNTER — Encounter: Payer: Self-pay | Admitting: Obstetrics and Gynecology

## 2022-02-28 LAB — COMPREHENSIVE METABOLIC PANEL
AG Ratio: 2.1 (calc) (ref 1.0–2.5)
ALT: 13 U/L (ref 6–29)
AST: 24 U/L (ref 10–35)
Albumin: 4.7 g/dL (ref 3.6–5.1)
Alkaline phosphatase (APISO): 47 U/L (ref 37–153)
BUN: 18 mg/dL (ref 7–25)
CO2: 26 mmol/L (ref 20–32)
Calcium: 9 mg/dL (ref 8.6–10.4)
Chloride: 104 mmol/L (ref 98–110)
Creat: 1.01 mg/dL (ref 0.50–1.03)
Globulin: 2.2 g/dL (calc) (ref 1.9–3.7)
Glucose, Bld: 90 mg/dL (ref 65–99)
Potassium: 3.7 mmol/L (ref 3.5–5.3)
Sodium: 137 mmol/L (ref 135–146)
Total Bilirubin: 0.7 mg/dL (ref 0.2–1.2)
Total Protein: 6.9 g/dL (ref 6.1–8.1)

## 2022-02-28 LAB — CBC
HCT: 36.9 % (ref 35.0–45.0)
Hemoglobin: 12.6 g/dL (ref 11.7–15.5)
MCH: 30.3 pg (ref 27.0–33.0)
MCHC: 34.1 g/dL (ref 32.0–36.0)
MCV: 88.7 fL (ref 80.0–100.0)
MPV: 10.4 fL (ref 7.5–12.5)
Platelets: 254 10*3/uL (ref 140–400)
RBC: 4.16 10*6/uL (ref 3.80–5.10)
RDW: 12 % (ref 11.0–15.0)
WBC: 6 10*3/uL (ref 3.8–10.8)

## 2022-02-28 LAB — LIPID PANEL
Cholesterol: 233 mg/dL — ABNORMAL HIGH (ref <200)
HDL: 83 mg/dL (ref >=50)
LDL Cholesterol (Calc): 128 mg/dL (calc) — ABNORMAL HIGH
Non-HDL Cholesterol (Calc): 150 mg/dL (calc) — ABNORMAL HIGH (ref <130)
Total CHOL/HDL Ratio: 2.8 (calc) (ref <5.0)
Triglycerides: 110 mg/dL (ref <150)

## 2022-02-28 LAB — ESTRADIOL: Estradiol: 37 pg/mL

## 2022-02-28 LAB — VITAMIN D 25 HYDROXY (VIT D DEFICIENCY, FRACTURES): Vit D, 25-Hydroxy: 38 ng/mL (ref 30–100)

## 2022-02-28 LAB — FOLLICLE STIMULATING HORMONE: FSH: 104.9 m[IU]/mL

## 2022-03-10 ENCOUNTER — Other Ambulatory Visit (HOSPITAL_COMMUNITY): Payer: Self-pay

## 2022-03-29 DIAGNOSIS — Z1211 Encounter for screening for malignant neoplasm of colon: Secondary | ICD-10-CM | POA: Diagnosis not present

## 2022-04-06 LAB — COLOGUARD: COLOGUARD: NEGATIVE

## 2022-05-30 ENCOUNTER — Other Ambulatory Visit: Payer: Self-pay | Admitting: Obstetrics and Gynecology

## 2022-05-30 DIAGNOSIS — Z1231 Encounter for screening mammogram for malignant neoplasm of breast: Secondary | ICD-10-CM

## 2022-06-26 ENCOUNTER — Ambulatory Visit
Admission: RE | Admit: 2022-06-26 | Discharge: 2022-06-26 | Disposition: A | Discharge status: Home / Self Care | Payer: 59 | Source: Ambulatory Visit | Attending: Obstetrics and Gynecology | Admitting: Obstetrics and Gynecology

## 2022-06-26 DIAGNOSIS — Z1231 Encounter for screening mammogram for malignant neoplasm of breast: Secondary | ICD-10-CM

## 2022-06-29 DIAGNOSIS — H524 Presbyopia: Secondary | ICD-10-CM | POA: Diagnosis not present

## 2022-08-19 ENCOUNTER — Other Ambulatory Visit (HOSPITAL_COMMUNITY): Payer: Self-pay

## 2022-08-19 MED ORDER — METHYLPREDNISOLONE 4 MG PO TBPK
ORAL_TABLET | ORAL | 0 refills | Status: DC
  Filled 2022-08-19: qty 21, 6d supply, fill #0

## 2022-08-22 ENCOUNTER — Other Ambulatory Visit (HOSPITAL_COMMUNITY): Payer: Self-pay

## 2022-08-25 ENCOUNTER — Other Ambulatory Visit (HOSPITAL_COMMUNITY): Payer: Self-pay

## 2022-09-14 ENCOUNTER — Other Ambulatory Visit (HOSPITAL_COMMUNITY): Payer: Self-pay

## 2022-09-14 DIAGNOSIS — M5126 Other intervertebral disc displacement, lumbar region: Secondary | ICD-10-CM | POA: Diagnosis not present

## 2022-09-14 DIAGNOSIS — M5416 Radiculopathy, lumbar region: Secondary | ICD-10-CM | POA: Diagnosis not present

## 2022-09-14 MED ORDER — GABAPENTIN 100 MG PO CAPS
ORAL_CAPSULE | ORAL | 1 refills | Status: DC
  Filled 2022-09-14: qty 90, 30d supply, fill #0

## 2022-09-25 DIAGNOSIS — Z9889 Other specified postprocedural states: Secondary | ICD-10-CM | POA: Diagnosis not present

## 2022-09-25 DIAGNOSIS — M5416 Radiculopathy, lumbar region: Secondary | ICD-10-CM | POA: Diagnosis not present

## 2022-11-03 ENCOUNTER — Encounter: Payer: Self-pay | Admitting: Obstetrics and Gynecology

## 2022-11-06 NOTE — Telephone Encounter (Signed)
FYI. Pt scheduled for 11/13/2022. Will route to provider for final review and close encounter.

## 2022-11-06 NOTE — Progress Notes (Unsigned)
GYNECOLOGY  VISIT   HPI: 52 y.o.   Married  Caucasian  female   G2P2002 with No LMP recorded. (Menstrual status: IUD).   here for   consult for PM sxs management  Having sweats and not sleeping.   Not feeling joy. Home and work stressful.  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  Has lost weight.  Some stomach burning.   Did not take Neurontin for leg pain.  She had surgery in the past for herniated disc.  States she back goes out about once a year.   Has Mirena IUD.   FSH 104.9 on 02/27/22.   GYNECOLOGIC HISTORY: No LMP recorded. (Menstrual status: IUD). Contraception:  IUD--Mirena 05/03/17 Menopausal hormone therapy:  n/a Last mammogram:  06/26/22 Breast Density Category C, BI-RADS CATEGORY 2 benign Last pap smear:   02/18/19 neg: HR HPV neg, 01-20-16 Neg:Neg HR HPV         OB History     Gravida  2   Para  2   Term  2   Preterm      AB      Living  2      SAB      IAB      Ectopic      Multiple      Live Births                 Patient Active Problem List   Diagnosis Date Noted   H/O Spinal surgery 11/13/2022   Lump on finger 12/09/2019   Right shoulder pain 07/07/2016   Breast mass, right 02/14/2016   Elbow pain, chronic 05/20/2013   Lateral epicondylitis of right elbow 05/20/2013    Past Medical History:  Diagnosis Date   Complication of anesthesia    very sensitive to aneth and pain meds   DDD (degenerative disc disease), lumbar    History of COVID-19 01/02/2021   Medical history non-contributory    PONV (postoperative nausea and vomiting)     Past Surgical History:  Procedure Laterality Date   BREAST BIOPSY Left 02/03/2016   U/S- Benign   BREAST BIOPSY  04/24/2007   U/S Core- Benign   BREAST CYST ASPIRATION Left    BREAST EXCISIONAL BIOPSY Left 02/23/2016   benign   BREAST SURGERY  2005   Benign Rt.breast Bx   EXCISION METACARPAL MASS Right 01/07/2020   Procedure: Right index finger and right long finger mass  excision;  Surgeon: Iran Planas, MD;  Location: [facility];  Service: Orthopedics;  Laterality: Right;  with local anesthesia   HEMILAMINOTOMY LUMBAR SPINE Left 06/03/2008   L5-S1; with microdiscectomy and medial facetectomy   MASS EXCISION Right 04/03/2013   Procedure:  MASS EXCISION AND BIOPSY;  Surgeon: Linna Hoff, MD;  Location: [facility];  Service: Orthopedics;  Laterality: Right;   RADIOACTIVE SEED GUIDED EXCISIONAL BREAST BIOPSY Left 02/24/2016   Procedure: RADIOACTIVE SEED GUIDED EXCISIONAL LEFT BREAST BIOPSY;  Surgeon: Rolm Bookbinder, MD;  Location: [facility];  Service: General;  Laterality: Left;  RADIOACTIVE SEED GUIDED EXCISIONAL LEFT BREAST BIOPSY   WISDOM TOOTH EXTRACTION      Current Outpatient Medications  Medication Sig Dispense Refill   Cholecalciferol 25 MCG (1000 UT) capsule      levonorgestrel (MIRENA) 20 MCG/24HR IUD 1 each by Intrauterine route once.     Magnesium (RA NATURAL MAGNESIUM) 250 MG TABS      Multiple Vitamin (MULTI-VITAMIN) tablet  vitamin B-12 (CYANOCOBALAMIN) 1000 MCG tablet Take 1,000 mcg by mouth daily.     conjugated estrogens (PREMARIN) vaginal cream Use 1/2 g vaginally every night at bed time for the first 2 weeks, then use 1/2 g vaginally two or three times per week. (Patient not taking: Reported on 02/27/2022) 30 g 1   gabapentin (NEURONTIN) 100 MG capsule Take 1 capsule by mouth at night for 3 days, then 1 capsule every 12 hours for 3 days then 1 capsule every 8 hours (Patient not taking: Reported on 11/13/2022) 90 capsule 1   No current facility-administered medications for this visit.     ALLERGIES: Patient has no known allergies.  Family History  Problem Relation Age of Onset   CAD Father    Hypertension Father    Hyperlipidemia Father    Stroke Paternal Grandmother     Social History   Socioeconomic History   Marital status: Married    Spouse name: Not on file   Number of  children: Not on file   Years of education: Not on file   Highest education level: Not on file  Occupational History   Not on file  Tobacco Use   Smoking status: Never   Smokeless tobacco: Never  Vaping Use   Vaping Use: Never used  Substance and Sexual Activity   Alcohol use: Yes    Alcohol/week: 5.0 standard drinks of alcohol    Types: 5 Standard drinks or equivalent per week    Comment: 5 glassed wine/beer/week   Drug use: No   Sexual activity: Yes    Partners: Male    Birth control/protection: I.U.D.    Comment: Mirena IUD inserted 2018  Other Topics Concern   Not on file  Social History Narrative   Not on file   Social Determinants of Health   Financial Resource Strain: Not on file  Food Insecurity: Not on file  Transportation Needs: Not on file  Physical Activity: Not on file  Stress: Not on file  Social Connections: Not on file  Intimate Partner Violence: Not on file    Review of Systems  All other systems reviewed and are negative.   PHYSICAL EXAMINATION:    BP 118/84 (BP Location: Right Arm, Patient Position: Sitting, Cuff Size: Normal)   Pulse 85   Ht '5\' 3"'$  (1.6 m)   Wt 116 lb (52.6 kg)   SpO2 97%   BMI 20.55 kg/m     General appearance: alert, cooperative and appears stated age Head: Normocephalic, without obvious abnormality, atraumatic Neck: no adenopathy, supple, symmetrical, trachea midline and thyroid normal to inspection and palpation Lungs: clear to auscultation bilaterally Breasts: normal appearance, no masses or tenderness, No nipple retraction or dimpling, No nipple discharge or bleeding, No axillary or supraclavicular adenopathy Heart: regular rate and rhythm Abdomen: soft, non-tender, no masses,  no organomegaly Extremities: extremities normal, atraumatic, no cyanosis or edema Skin: Skin color, texture, turgor normal. No rashes or lesions Lymph nodes: Cervical, supraclavicular, and axillary nodes normal. No abnormal inguinal nodes  palpated Neurologic: Grossly normal  Pelvic: External genitalia:  no lesions              Urethra:  normal appearing urethra with no masses, tenderness or lesions              Bartholins and Skenes: normal                 Vagina: normal appearing vagina with normal color and discharge, no lesions  Cervix: no lesions                Bimanual Exam:  Uterus:  normal size, contour, position, consistency, mobility, non-tender              Adnexa: no mass, fullness, tenderness              Rectal exam: {yes no:[id number]}.  Confirms.              Anus:  normal sphincter tone, no lesions  Chaperone was present for exam:  ***  ASSESSMENT     PLAN     An After Visit Summary was printed and given to the patient.  ______ minutes face to face time of which over 50% was spent in counseling.

## 2022-11-13 ENCOUNTER — Ambulatory Visit (INDEPENDENT_AMBULATORY_CARE_PROVIDER_SITE_OTHER): Payer: 59 | Admitting: Obstetrics and Gynecology

## 2022-11-13 ENCOUNTER — Encounter: Payer: Self-pay | Admitting: Obstetrics and Gynecology

## 2022-11-13 ENCOUNTER — Other Ambulatory Visit (HOSPITAL_COMMUNITY): Payer: Self-pay

## 2022-11-13 VITALS — BP 118/84 | HR 85 | Ht 63.0 in | Wt 116.0 lb

## 2022-11-13 DIAGNOSIS — N951 Menopausal and female climacteric states: Secondary | ICD-10-CM | POA: Diagnosis not present

## 2022-11-13 DIAGNOSIS — Z9889 Other specified postprocedural states: Secondary | ICD-10-CM | POA: Insufficient documentation

## 2022-11-13 MED ORDER — PAROXETINE HCL 10 MG PO TABS
10.0000 mg | ORAL_TABLET | ORAL | 0 refills | Status: DC
  Filled 2022-11-13: qty 90, 90d supply, fill #0

## 2022-11-13 NOTE — Patient Instructions (Signed)
Venlafaxine Extended-Release Capsules What is this medication? VENLAFAXINE (VEN la fax een) treats depression and anxiety. It increases the amount of serotonin and norepinephrine in the brain, hormones that help regulate mood. It belongs to a group of medications called SNRIs. This medicine may be used for other purposes; ask your health care provider or pharmacist if you have questions. COMMON BRAND NAME(S): Effexor XR What should I tell my care team before I take this medication? They need to know if you have any of these conditions: Bleeding disorders Glaucoma Heart disease High blood pressure High cholesterol Kidney disease Liver disease Low levels of sodium in the blood Mania or bipolar disorder Seizures Suicidal thoughts, plans, or attempt; a previous suicide attempt by you or a family Take medications that treat or prevent blood clots Thyroid disease An unusual or allergic reaction to venlafaxine, desvenlafaxine, other medications, foods, dyes, or preservatives Pregnant or trying to get pregnant Breast-feeding How should I use this medication? Take this medication by mouth with a full glass of water. Follow the directions on the prescription label. Do not cut, crush, or chew this medication. Take it with food. If needed, the capsule may be carefully opened and the entire contents sprinkled on a spoonful of cool applesauce. Swallow the applesauce/pellet mixture right away without chewing and follow with a glass of water to ensure complete swallowing of the pellets. Try to take your medication at about the same time each day. Do not take your medication more often than directed. Do not stop taking this medication suddenly except upon the advice of your care team. Stopping this medication too quickly may cause serious side effects or your condition may worsen. A special MedGuide will be given to you by the pharmacist with each prescription and refill. Be sure to read this information  carefully each time. Talk to your care team regarding the use of this medication in children. Special care may be needed. Overdosage: If you think you have taken too much of this medicine contact a poison control center or emergency room at once. NOTE: This medicine is only for you. Do not share this medicine with others. What if I miss a dose? If you miss a dose, take it as soon as you can. If it is almost time for your next dose, take only that dose. Do not take double or extra doses. What may interact with this medication? Do not take this medication with any of the following: Certain medications for fungal infections like fluconazole, itraconazole, ketoconazole, posaconazole, voriconazole Cisapride Desvenlafaxine Dronedarone Duloxetine Levomilnacipran Linezolid MAOIs like Carbex, Eldepryl, Marplan, Nardil, and Parnate Methylene blue (injected into a vein) Milnacipran Pimozide Thioridazine This medication may also interact with the following: Amphetamines Aspirin and aspirin-like medications Certain medications for depression, anxiety, or psychotic disturbances Certain medications for migraine headaches like almotriptan, eletriptan, frovatriptan, naratriptan, rizatriptan, sumatriptan, zolmitriptan Certain medications for sleep Certain medications that treat or prevent blood clots like dalteparin, enoxaparin, warfarin Cimetidine Clozapine Diuretics Fentanyl Furazolidone Indinavir Isoniazid Lithium Metoprolol NSAIDS, medications for pain and inflammation, like ibuprofen or naproxen Other medications that prolong the QT interval (cause an abnormal heart rhythm) like dofetilide, ziprasidone Procarbazine Rasagiline Supplements like St. John's wort, kava kava, valerian Tramadol Tryptophan This list may not describe all possible interactions. Give your health care provider a list of all the medicines, herbs, non-prescription drugs, or dietary supplements you use. Also tell them  if you smoke, drink alcohol, or use illegal drugs. Some items may interact with your medicine. What should   I watch for while using this medication? Tell your care team if your symptoms do not get better or if they get worse. Visit your care team for regular checks on your progress. Because it may take several weeks to see the full effects of this medication, it is important to continue your treatment as prescribed by your care team. Watch for new or worsening thoughts of suicide or depression. This includes sudden changes in mood, behaviors, or thoughts. These changes can happen at any time but are more common in the beginning of treatment or after a change in dose. Call your care team right away if you experience these thoughts or worsening depression. Manic episodes may happen in patients with bipolar disorder who take this medication. Watch for changes in feelings or behaviors such as feeling anxious, nervous, agitated, panicky, irritable, hostile, aggressive, impulsive, severely restless, overly excited and hyperactive, or trouble sleeping. These changes can happen at any time but are more common in the beginning of treatment or after a change in dose. Call your care team right away if you notice any of these symptoms. This medication can cause an increase in blood pressure. Check with your care team for instructions on monitoring your blood pressure while taking this medication. You may get drowsy or dizzy. Do not drive, use machinery, or do anything that needs mental alertness until you know how this medication affects you. Do not stand or sit up quickly, especially if you are an older patient. This reduces the risk of dizzy or fainting spells. Do not drink alcohol while taking this medication. Drinking alcohol may alter the effects of your medication. Serious side effects may occur. Your mouth may get dry. Chewing sugarless gum, sucking hard candy and drinking plenty of water will help. Contact your  care team if the problem does not go away or is severe. What side effects may I notice from receiving this medication? Side effects that you should report to your care team as soon as possible: Allergic reactions--skin rash, itching, hives, swelling of the face, lips, tongue, or throat Bleeding--bloody or black, tar-like stools, red or dark brown urine, vomiting blood or brown material that looks like coffee grounds, small, red or purple spots on skin, unusual bleeding or bruising Heart rhythm changes--fast or irregular heartbeat, dizziness, feeling faint or lightheaded, chest pain, trouble breathing Increase in blood pressure Loss of appetite with weight loss Low sodium level--muscle weakness, fatigue, dizziness, headache, confusion Serotonin syndrome--irritability, confusion, fast or irregular heartbeat, muscle stiffness, twitching muscles, sweating, high fever, seizures, chills, vomiting, diarrhea Sudden eye pain or change in vision such as blurry vision, seeing halos around lights, vision loss Thoughts of suicide or self-harm, worsening mood, feelings of depression Side effects that usually do not require medical attention (report to your care team if they continue or are bothersome): Anxiety, nervousness Change in sex drive or performance Dizziness Dry mouth Excessive sweating Nausea Tremors or shaking Trouble sleeping This list may not describe all possible side effects. Call your doctor for medical advice about side effects. You may report side effects to FDA at 1-800-FDA-1088. Where should I keep my medication? Keep out of the reach of children and pets. Store at a controlled temperature between 20 and 25 degrees C (68 degrees and 77 degrees F), in a dry place. Throw away any unused medication after the expiration date. NOTE: This sheet is a summary. It may not cover all possible information. If you have questions about this medicine, talk to your doctor,   pharmacist, or health care  provider.  2023 Elsevier/Gold Standard (2020-08-11 00:00:00)   Paroxetine Tablets What is this medication? PAROXETINE (pa ROX e teen) treats depression, anxiety, obsessive-compulsive disorder (OCD), post-traumatic stress disorder (PTSD), and premenstrual dysphoric disorder (PMDD). It increases the amount of serotonin in the brain, a hormone that helps regulate mood. It belongs to a group of medications called SSRIs. This medicine may be used for other purposes; ask your health care provider or pharmacist if you have questions. COMMON BRAND NAME(S): Paxil, Pexeva What should I tell my care team before I take this medication? They need to know if you have any of these conditions: Bipolar disorder or a family history of bipolar disorder Bleeding disorders Glaucoma Heart disease Kidney disease Liver disease Low levels of sodium in the blood Seizures Suicidal thoughts, plans, or attempt; a previous suicide attempt by you or a family member Take MAOIs like Carbex, Eldepryl, Marplan, Nardil, and Parnate Take medications that treat or prevent blood clots Thyroid disease An unusual or allergic reaction to paroxetine, other medications, foods, dyes, or preservatives Pregnant or trying to get pregnant Breast-feeding How should I use this medication? Take this medication by mouth with a glass of water. Follow the directions on the prescription label. You can take it with or without food. Take your medication at regular intervals. Do not take your medication more often than directed. Do not stop taking this medication suddenly except upon the advice of your care team. Stopping this medication too quickly may cause serious side effects or your condition may worsen. A special MedGuide will be given to you by the pharmacist with each prescription and refill. Be sure to read this information carefully each time. Talk to your care team regarding the use of this medication in children. Special care may be  needed. Overdosage: If you think you have taken too much of this medicine contact a poison control center or emergency room at once. NOTE: This medicine is only for you. Do not share this medicine with others. What if I miss a dose? If you miss a dose, take it as soon as you can. If it is almost time for your next dose, take only that dose. Do not take double or extra doses. What may interact with this medication? Do not take this medication with any of the following: Linezolid MAOIs like Carbex, Eldepryl, Marplan, Nardil, and Parnate Methylene blue (injected into a vein) Pimozide Thioridazine This medication may also interact with the following: Alcohol Amphetamines Aspirin and aspirin-like medications Atomoxetine Certain medications for depression, anxiety, or psychotic disturbances Certain medications for irregular heart beat like propafenone, flecainide, encainide, and quinidine Certain medications for migraine headache like almotriptan, eletriptan, frovatriptan, naratriptan, rizatriptan, sumatriptan, zolmitriptan Cimetidine Digoxin Diuretics Fentanyl Fosamprenavir Furazolidone Isoniazid Lithium Medications that treat or prevent blood clots like warfarin, enoxaparin, and dalteparin Medications for sleep NSAIDs, medications for pain and inflammation, like ibuprofen or naproxen Phenobarbital Phenytoin Procarbazine Rasagiline Ritonavir Supplements like St. John's wort, kava kava, valerian Tamoxifen Tramadol Tryptophan This list may not describe all possible interactions. Give your health care provider a list of all the medicines, herbs, non-prescription drugs, or dietary supplements you use. Also tell them if you smoke, drink alcohol, or use illegal drugs. Some items may interact with your medicine. What should I watch for while using this medication? Tell your care team if your symptoms do not get better or if they get worse. Visit your care team for regular checks on your  progress. Because it may  take several weeks to see the full effects of this medication, it is important to continue your treatment as prescribed by your care team. Watch for new or worsening thoughts of suicide or depression. This includes sudden changes in mood, behaviors, or thoughts. These changes can happen at any time but are more common in the beginning of treatment or after a change in dose. Call your care team right away if you experience these thoughts or worsening depression. Manic episodes may happen in patients with bipolar disorder who take this medication. Watch for changes in feelings or behaviors such as feeling anxious, nervous, agitated, panicky, irritable, hostile, aggressive, impulsive, severely restless, overly excited and hyperactive, or trouble sleeping. These changes can happen at any time but are more common in the beginning of treatment or after a change in dose. Call your care team right away if you notice any of these symptoms. You may get drowsy or dizzy. Do not drive, use machinery, or do anything that needs mental alertness until you know how this medication affects you. Do not stand or sit up quickly, especially if you are an older patient. This reduces the risk of dizzy or fainting spells. Alcohol may interfere with the effect of this medication. Avoid alcoholic drinks. Your mouth may get dry. Chewing sugarless gum or sucking hard candy, and drinking plenty of water will help. Contact your care team if the problem does not go away or is severe. What side effects may I notice from receiving this medication? Side effects that you should report to your care team as soon as possible: Allergic reactions--skin rash, itching, hives, swelling of the face, lips, tongue, or throat Bleeding--bloody or black, tar-like stools, red or dark brown urine, vomiting blood or brown material that looks like coffee grounds, small, red or purple spots on skin, unusual bleeding or bruising Heart  rhythm changes--fast or irregular heartbeat, dizziness, feeling faint or lightheaded, chest pain, trouble breathing Low sodium level--muscle weakness, fatigue, dizziness, headache, confusion Serotonin syndrome--irritability, confusion, fast or irregular heartbeat, muscle stiffness, twitching muscles, sweating, high fever, seizures, chills, vomiting, diarrhea Sudden eye pain or change in vision such as blurry vision, seeing halos around lights, vision loss Thoughts of suicide or self-harm, worsening mood, feelings of depression Side effects that usually do not require medical attention (report to your care team if they continue or are bothersome): Change in sex drive or performance Diarrhea Excessive sweating Nausea Tremors or shaking Upset stomach This list may not describe all possible side effects. Call your doctor for medical advice about side effects. You may report side effects to FDA at 1-800-FDA-1088. Where should I keep my medication? Keep out of the reach of children and pets. Store at room temperature between 15 and 30 degrees C (59 and 86 degrees F). Keep container tightly closed. Throw away any unused medication after the expiration date. NOTE: This sheet is a summary. It may not cover all possible information. If you have questions about this medicine, talk to your doctor, pharmacist, or health care provider.  2023 Elsevier/Gold Standard (2004-09-21 00:00:00)   Menopause and Hormone Replacement Therapy Menopause is a normal time of life when menstrual periods stop completely and the ovaries stop producing the female hormones estrogen and progesterone. Low levels of these hormones can affect your health and cause symptoms. Hormone replacement therapy (HRT) can relieve some of those symptoms. HRT is the use of artificial (synthetic) hormones to replace hormones that your body has stopped producing because you have reached menopause. Types  of HRT  HRT may consist of the synthetic  hormones estrogen and progestin, or it may consist of estrogen-only therapy. You and your health care provider will decide which form of HRT is best for you. If you choose to be on HRT and you have a uterus, estrogen and progestin are usually prescribed. Estrogen-only therapy is used for women who do not have a uterus. Possible options for taking HRT include: Pills. Patches. Gels. Sprays. Vaginal cream. Vaginal rings. Vaginal inserts. The amount of hormones that you take and how long you take them varies according to your health. It is important to: Begin HRT with the lowest possible dosage. Stop HRT as soon as your health care provider tells you to stop. Work with your health care provider so that you feel informed and comfortable with your decisions. Tell a health care provider about: Any allergies you have. Whether you have had blood clots or know of any risk factors you may have for blood clots. Whether you or family members have had cancer, especially cancer of the breasts, ovaries, or uterus. Any surgeries you have had. All medicines you are taking, including vitamins, herbs, eye drops, creams, and over-the-counter medicines. Whether you are pregnant or may be pregnant. Any medical conditions you have. What are the benefits? HRT can reduce the frequency and severity of menopausal symptoms. Benefits of HRT vary according to the kind of symptoms that you have, how severe they are, and your overall health. HRT may help to improve the following symptoms of menopause: Hot flashes and night sweats. These are sudden feelings of heat that spread over the face and body. The skin may turn red, like a blush. Night sweats are hot flashes that happen while you are sleeping or trying to sleep. Bone loss (osteoporosis). The body loses calcium more quickly after menopause, causing the bones to become weaker. This can increase the risk for bone breaks (fractures). Vaginal dryness. The lining of the  vagina can become thin and dry, which can cause pain during sex or cause infection, burning, or itching. Urinary tract infections. Urinary incontinence. This is the inability to control when you urinate. Irritability. Short-term memory problems. What are the risks? Risks of HRT vary depending on your individual health and medical history. Risks of HRT also depend on whether you receive both estrogen and progestin or you receive estrogen only. HRT may increase the risk of: Spotting. This is when a small amount of blood leaks from the vagina unexpectedly. Endometrial cancer. This cancer is in the lining of the uterus (endometrium). Breast cancer. Increased density of breast tissue. This can make it harder to find breast cancer on a breast X-ray (mammogram). Stroke. Heart disease. Blood clots. Gallbladder disease or liver disease. Risks of HRT can increase if you have any of the following conditions: Endometrial cancer. Liver disease. Heart disease. Breast cancer. History of blood clots. History of stroke. Follow these instructions at home: Pap tests Have Pap tests done as often as told by your health care provider. A Pap test is sometimes called a Pap smear. It is a screening test that is used to check for signs of cancer of the cervix and vagina. A Pap test can also identify the presence of infection or precancerous changes. Pap tests may be done: Every 3 years, starting at age 72. Every 5 years, starting after age 22, in combination with testing for human papillomavirus (HPV). More often or less often depending on other medical conditions you have, your age, and  other risk factors. It is up to you to get the results of your Pap test. Ask your health care provider, or the department that is doing the test, when your results will be ready. General instructions Take over-the-counter and prescription medicines only as told by your health care provider. Do not use any products that contain  nicotine or tobacco. These products include cigarettes, chewing tobacco, and vaping devices, such as e-cigarettes. If you need help quitting, ask your health care provider. Get mammograms, pelvic exams, and medical checkups as often as told by your health care provider. Keep all follow-up visits. This is important. Contact a health care provider if you have: Pain or swelling in your legs. Lumps or changes in your breasts or armpits. Pain, burning, or bleeding when you urinate. Unusual vaginal bleeding. Dizziness or headaches. Pain in your abdomen. Get help right away if you have: Shortness of breath. Chest pain. Slurred speech. Weakness or numbness in any part of your arms or legs. These symptoms may represent a serious problem that is an emergency. Do not wait to see if the symptoms will go away. Get medical help right away. Call your local emergency services (911 in the [place] Do not drive yourself to the hospital. Summary Menopause is a normal time of life when menstrual periods stop completely and the ovaries stop producing the female hormones estrogen and progesterone. HRT can reduce the frequency and severity of menopausal symptoms. Risks of HRT vary depending on your individual health and medical history. This information is not intended to replace advice given to you by your health care provider. Make sure you discuss any questions you have with your health care provider. Document Revised: 02/23/2020 Document Reviewed: 02/23/2020 Elsevier Patient Education  [employer].

## 2022-11-14 ENCOUNTER — Other Ambulatory Visit (HOSPITAL_COMMUNITY): Payer: Self-pay

## 2022-12-12 NOTE — Progress Notes (Signed)
GYNECOLOGY  VISIT   HPI: 52 y.o.   Married  Caucasian  female   G2P2002 with No LMP recorded. (Menstrual status: IUD).   here for   6 week med recheck.  Wants blood work today.  Started Paxil for menopausal symptoms and stress.   Having headaches at night, starting week 3 of Paxil therapy.  If she takes electrolytes, the headache goes away.   Tossing and turning still at night.   Having hair loss.   Night sweats are improved but still having them.  Less hot flashes.   Emotions are mentality are better for patient.  Has much less worry and does not feel doom.   Has coaching at work.  Started working out.   GYNECOLOGIC HISTORY: No LMP recorded. (Menstrual status: IUD). Contraception:  IUD--Mirena 05/03/17 Menopausal hormone therapy:  n/a Last mammogram:  06/26/22 Breast Density Category C, BI-RADS CAT 2 benign Last pap smear:    02/18/19 neg: HR HPV neg, 01-20-16 Neg:Neg HR HPV         OB History     Gravida  2   Para  2   Term  2   Preterm      AB      Living  2      SAB      IAB      Ectopic      Multiple      Live Births                 Patient Active Problem List   Diagnosis Date Noted   H/O Spinal surgery 11/13/2022   Lump on finger 12/09/2019   Right shoulder pain 07/07/2016   Breast mass, right 02/14/2016   Elbow pain, chronic 05/20/2013   Lateral epicondylitis of right elbow 05/20/2013    Past Medical History:  Diagnosis Date   Complication of anesthesia    very sensitive to aneth and pain meds   DDD (degenerative disc disease), lumbar    History of COVID-19 01/02/2021   Medical history non-contributory    PONV (postoperative nausea and vomiting)     Past Surgical History:  Procedure Laterality Date   BREAST BIOPSY Left 02/03/2016   U/S- Benign   BREAST BIOPSY  04/24/2007   U/S Core- Benign   BREAST CYST ASPIRATION Left    BREAST EXCISIONAL BIOPSY Left 02/23/2016   benign   BREAST SURGERY  2005   Benign Rt.breast Bx    EXCISION METACARPAL MASS Right 01/07/2020   Procedure: Right index finger and right long finger mass excision;  Surgeon: Bradly Bienenstockrtmann, Fred, MD;  Location: [facility];  Service: Orthopedics;  Laterality: Right;  with local anesthesia   HEMILAMINOTOMY LUMBAR SPINE Left 06/03/2008   L5-S1; with microdiscectomy and medial facetectomy   MASS EXCISION Right 04/03/2013   Procedure:  MASS EXCISION AND BIOPSY;  Surgeon: Sharma CovertFred W Ortmann, MD;  Location: [facility];  Service: Orthopedics;  Laterality: Right;   RADIOACTIVE SEED GUIDED EXCISIONAL BREAST BIOPSY Left 02/24/2016   Procedure: RADIOACTIVE SEED GUIDED EXCISIONAL LEFT BREAST BIOPSY;  Surgeon: Emelia LoronMatthew Wakefield, MD;  Location: [facility];  Service: General;  Laterality: Left;  RADIOACTIVE SEED GUIDED EXCISIONAL LEFT BREAST BIOPSY   WISDOM TOOTH EXTRACTION      Current Outpatient Medications  Medication Sig Dispense Refill   Cholecalciferol 25 MCG (1000 UT) capsule      levonorgestrel (MIRENA) 20 MCG/24HR IUD 1 each by Intrauterine route once.     Magnesium (  RA NATURAL MAGNESIUM) 250 MG TABS      Multiple Vitamin (MULTI-VITAMIN) tablet      vitamin B-12 (CYANOCOBALAMIN) 1000 MCG tablet Take 1,000 mcg by mouth daily.     PARoxetine (PAXIL) 10 MG tablet Take 1 tablet (10 mg total) by mouth every morning. 90 tablet 0   No current facility-administered medications for this visit.     ALLERGIES: Patient has no known allergies.  Family History  Problem Relation Age of Onset   CAD Father    Hypertension Father    Hyperlipidemia Father    Stroke Paternal Grandmother     Social History   Socioeconomic History   Marital status: Married    Spouse name: Not on file   Number of children: Not on file   Years of education: Not on file   Highest education level: Not on file  Occupational History   Not on file  Tobacco Use   Smoking status: Never   Smokeless tobacco: Never  Vaping Use   Vaping Use: Never  used  Substance and Sexual Activity   Alcohol use: Yes    Alcohol/week: 5.0 standard drinks of alcohol    Types: 5 Standard drinks or equivalent per week    Comment: 5 glassed wine/beer/week   Drug use: No   Sexual activity: Yes    Partners: Male    Birth control/protection: I.U.D.    Comment: Mirena IUD inserted 2018  Other Topics Concern   Not on file  Social History Narrative   Not on file   Social Determinants of Health   Financial Resource Strain: Not on file  Food Insecurity: Not on file  Transportation Needs: Not on file  Physical Activity: Not on file  Stress: Not on file  Social Connections: Not on file  Intimate Partner Violence: Not on file    Review of Systems  All other systems reviewed and are negative.   PHYSICAL EXAMINATION:    BP 112/68 (BP Location: Right Arm, Patient Position: Sitting, Cuff Size: Normal)   Pulse 61   Ht 5\' 3"  (1.6 m)   Wt 116 lb (52.6 kg)   SpO2 98%   BMI 20.55 kg/m     General appearance: alert, cooperative and appears stated age  ASSESSMENT  Menopausal symptoms.  Hair loss.  Stress.  Encounter for medication monitoring.  FH CAD.   PLAN  Continue Paxil 10 mg daily.  Blood work today: FSH, estradiol, testosterone, TSH, iron, ferritin.  Consider HRT low dose if symptoms are not well controlled. Patient will continue to monitor symptoms.  If starts HRT I would suggest Vivelle Dot 0.0375 mg and Prometrium 100 mg q hs.  Discused WHI and use of HRT which can increase risk of PE, DVT, MI, stroke and breast cancer.  Benefits also discussed.  FU in June for AEX and fasting labs.  35 min  total time was spent for this patient encounter, including preparation, face-to-face counseling with the patient, coordination of care, and documentation of the encounter.

## 2022-12-26 ENCOUNTER — Ambulatory Visit: Payer: 59 | Admitting: Obstetrics and Gynecology

## 2022-12-26 ENCOUNTER — Other Ambulatory Visit (HOSPITAL_COMMUNITY): Payer: Self-pay

## 2022-12-26 ENCOUNTER — Encounter: Payer: Self-pay | Admitting: Obstetrics and Gynecology

## 2022-12-26 VITALS — BP 112/68 | HR 61 | Ht 63.0 in | Wt 116.0 lb

## 2022-12-26 DIAGNOSIS — N951 Menopausal and female climacteric states: Secondary | ICD-10-CM

## 2022-12-26 DIAGNOSIS — F439 Reaction to severe stress, unspecified: Secondary | ICD-10-CM

## 2022-12-26 DIAGNOSIS — L659 Nonscarring hair loss, unspecified: Secondary | ICD-10-CM

## 2022-12-26 DIAGNOSIS — Z5181 Encounter for therapeutic drug level monitoring: Secondary | ICD-10-CM

## 2022-12-26 MED ORDER — PAROXETINE HCL 10 MG PO TABS
10.0000 mg | ORAL_TABLET | ORAL | 0 refills | Status: DC
  Filled 2022-12-26 – 2023-02-07 (×2): qty 90, 90d supply, fill #0

## 2022-12-26 NOTE — Patient Instructions (Signed)

## 2022-12-29 LAB — TSH: TSH: 1.81 mIU/L

## 2022-12-29 LAB — CBC
HCT: 40.1 % (ref 35.0–45.0)
Hemoglobin: 13.3 g/dL (ref 11.7–15.5)
MCH: 30.4 pg (ref 27.0–33.0)
MCHC: 33.2 g/dL (ref 32.0–36.0)
MCV: 91.8 fL (ref 80.0–100.0)
MPV: 10.3 fL (ref 7.5–12.5)
Platelets: 266 10*3/uL (ref 140–400)
RBC: 4.37 10*6/uL (ref 3.80–5.10)
RDW: 11.8 % (ref 11.0–15.0)
WBC: 5.4 10*3/uL (ref 3.8–10.8)

## 2022-12-29 LAB — TESTOS,TOTAL,FREE AND SHBG (FEMALE)
Free Testosterone: 0.9 pg/mL (ref 0.1–6.4)
Sex Hormone Binding: 61.4 nmol/L (ref 17–124)
Testosterone, Total, LC-MS-MS: 10 ng/dL (ref 2–45)

## 2022-12-29 LAB — FOLLICLE STIMULATING HORMONE: FSH: 125.9 m[IU]/mL — ABNORMAL HIGH

## 2022-12-29 LAB — ESTRADIOL: Estradiol: <15 pg/mL

## 2022-12-29 LAB — IRON: Iron: 79 ug/dL (ref 45–160)

## 2023-01-02 ENCOUNTER — Other Ambulatory Visit (HOSPITAL_COMMUNITY): Payer: Self-pay

## 2023-02-07 ENCOUNTER — Other Ambulatory Visit (HOSPITAL_COMMUNITY): Payer: Self-pay

## 2023-03-07 NOTE — Progress Notes (Signed)
52 y.o. G56P2002 Married Caucasian female here for annual exam.    Paxil is helping to make her feels settled.  Emotions are stabilized.  It has decreased hot flashes and night sweats.   Painful intercourse. Bleeding from perineum.  Notes skin change of her left wrist.   Fasted today for blood work.   PCP:   None  No LMP recorded. (Menstrual status: IUD).           Sexually active:  yes.   The current method of family planning is IUD.   Mirena placed 05/03/17.  Exercising: walking, biking Smoker:  no  Health Maintenance: Pap: 02-18-19 Neg:Neg HR HPV, 01-20-16 Neg:Neg HR HPV History of abnormal Pap:  no MMG:  06/28/22 Density C Bi-rads 2 benign  Colonoscopy:  cologuard 2023 BMD:   n/a  Result  n/a TDaP:  01-20-16 Gardasil:   no HIV:  Neg in the past Hep C: Neg Screening Labs:  PCP   reports that she has never smoked. She has never used smokeless tobacco. She reports current alcohol use of about 5.0 standard drinks of alcohol per week. She reports that she does not use drugs.  Past Medical History:  Diagnosis Date   Complication of anesthesia    very sensitive to aneth and pain meds   DDD (degenerative disc disease), lumbar    History of COVID-19 01/02/2021   Medical history non-contributory    PONV (postoperative nausea and vomiting)     Past Surgical History:  Procedure Laterality Date   BREAST BIOPSY Left 02/03/2016   U/S- Benign   BREAST BIOPSY  04/24/2007   U/S Core- Benign   BREAST CYST ASPIRATION Left    BREAST EXCISIONAL BIOPSY Left 02/23/2016   benign   BREAST SURGERY  2005   Benign Rt.breast Bx   EXCISION METACARPAL MASS Right 01/07/2020   Procedure: Right index finger and right long finger mass excision;  Surgeon: Bradly Bienenstock, MD;  Location: [facility];  Service: Orthopedics;  Laterality: Right;  with local anesthesia   HEMILAMINOTOMY LUMBAR SPINE Left 06/03/2008   L5-S1; with microdiscectomy and medial facetectomy   MASS EXCISION Right  04/03/2013   Procedure:  MASS EXCISION AND BIOPSY;  Surgeon: Sharma Covert, MD;  Location: [facility];  Service: Orthopedics;  Laterality: Right;   RADIOACTIVE SEED GUIDED EXCISIONAL BREAST BIOPSY Left 02/24/2016   Procedure: RADIOACTIVE SEED GUIDED EXCISIONAL LEFT BREAST BIOPSY;  Surgeon: Emelia Loron, MD;  Location: [facility];  Service: General;  Laterality: Left;  RADIOACTIVE SEED GUIDED EXCISIONAL LEFT BREAST BIOPSY   WISDOM TOOTH EXTRACTION      Current Outpatient Medications  Medication Sig Dispense Refill   Cholecalciferol 25 MCG (1000 UT) capsule      levonorgestrel (MIRENA) 20 MCG/24HR IUD 1 each by Intrauterine route once.     Magnesium (RA NATURAL MAGNESIUM) 250 MG TABS      Multiple Vitamin (MULTI-VITAMIN) tablet      PARoxetine (PAXIL) 10 MG tablet Take 1 tablet (10 mg total) by mouth every morning. 90 tablet 0   vitamin B-12 (CYANOCOBALAMIN) 1000 MCG tablet Take 1,000 mcg by mouth daily.     No current facility-administered medications for this visit.    Family History  Problem Relation Age of Onset   CAD Father    Hypertension Father    Hyperlipidemia Father    Stroke Paternal Grandmother     Review of Systems  All other systems reviewed and are negative.  Exam:   BP 116/82 (BP Location: Right Arm, Patient Position: Sitting, Cuff Size: Normal)   Pulse 81   Ht 5\' 3"  (1.6 m)   Wt 118 lb (53.5 kg)   BMI 20.90 kg/m     General appearance: alert, cooperative and appears stated age Head: normocephalic, without obvious abnormality, atraumatic Neck: no adenopathy, supple, symmetrical, trachea midline and thyroid normal to inspection and palpation Lungs: clear to auscultation bilaterally Breasts: normal appearance, no masses or tenderness, No nipple retraction or dimpling, No nipple discharge or bleeding, No axillary adenopathy Heart: regular rate and rhythm Abdomen: soft, non-tender; no masses, no organomegaly Extremities:  extremities normal, atraumatic, no cyanosis or edema Skin: skin color, texture, turgor normal. No rashes or lesions Lymph nodes: cervical, supraclavicular, and axillary nodes normal. Neurologic: grossly normal  Pelvic: External genitalia:  no lesions              No abnormal inguinal nodes palpated.              Urethra:  normal appearing urethra with no masses, tenderness or lesions              Bartholins and Skenes: normal                 Vagina: normal appearing vagina with normal color and discharge, no lesions              Cervix: no lesions.  IUD strings noted.               Pap taken: no Bimanual Exam:  Uterus:  normal size, contour, position, consistency, mobility, non-tender              Adnexa: no mass, fullness, tenderness              Rectal exam: yes.  Confirms.              Anus:  normal sphincter tone, no lesions  Chaperone was present for exam:  Mellissa Kohut, CMA  Assessment:   Well woman visit with gynecologic exam. Mirena IUD.  Threads seen today.  Removal due in 2026. Menopausal symptoms.  Vaginal atrophy.  Status post left breast biopsy. Hx GSI.   Plan: Mammogram screening discussed. Self breast awareness reviewed. Pap and HR HPV as above. Guidelines for Calcium, Vitamin D, regular exercise program including cardiovascular and weight bearing exercise. Will continue Paxil 10 mg daily. #90. RF 3.  Rx for vaginal estrogen cream.  Instructed in use.  Declines HRT after reviewing risks and benefits.  She will establish care with a dermatologist.  Routine labs today. Follow up annually and prn.   After visit summary provided.

## 2023-03-13 ENCOUNTER — Other Ambulatory Visit: Payer: Self-pay | Admitting: Oncology

## 2023-03-13 DIAGNOSIS — Z006 Encounter for examination for normal comparison and control in clinical research program: Secondary | ICD-10-CM

## 2023-03-15 ENCOUNTER — Other Ambulatory Visit (HOSPITAL_COMMUNITY): Payer: Self-pay

## 2023-03-15 ENCOUNTER — Encounter: Payer: Self-pay | Admitting: Obstetrics and Gynecology

## 2023-03-15 ENCOUNTER — Ambulatory Visit (INDEPENDENT_AMBULATORY_CARE_PROVIDER_SITE_OTHER): Payer: 59 | Admitting: Obstetrics and Gynecology

## 2023-03-15 VITALS — BP 116/82 | HR 81 | Ht 63.0 in | Wt 118.0 lb

## 2023-03-15 DIAGNOSIS — E559 Vitamin D deficiency, unspecified: Secondary | ICD-10-CM | POA: Diagnosis not present

## 2023-03-15 DIAGNOSIS — Z0000 Encounter for general adult medical examination without abnormal findings: Secondary | ICD-10-CM | POA: Diagnosis not present

## 2023-03-15 DIAGNOSIS — Z01419 Encounter for gynecological examination (general) (routine) without abnormal findings: Secondary | ICD-10-CM | POA: Diagnosis not present

## 2023-03-15 DIAGNOSIS — E785 Hyperlipidemia, unspecified: Secondary | ICD-10-CM | POA: Diagnosis not present

## 2023-03-15 MED ORDER — PAROXETINE HCL 10 MG PO TABS
10.0000 mg | ORAL_TABLET | ORAL | 3 refills | Status: DC
  Filled 2023-03-15 – 2023-05-14 (×2): qty 90, 90d supply, fill #0
  Filled 2023-08-08: qty 90, 90d supply, fill #1
  Filled 2023-11-08: qty 90, 90d supply, fill #2
  Filled 2024-02-13: qty 90, 90d supply, fill #3

## 2023-03-15 MED ORDER — ESTRADIOL 0.1 MG/GM VA CREA
TOPICAL_CREAM | VAGINAL | 2 refills | Status: DC
  Filled 2023-03-15: qty 42.5, 90d supply, fill #0
  Filled 2024-02-13: qty 42.5, 90d supply, fill #1

## 2023-03-15 NOTE — Patient Instructions (Signed)

## 2023-03-16 ENCOUNTER — Encounter: Payer: Self-pay | Admitting: Obstetrics and Gynecology

## 2023-03-16 LAB — LIPID PANEL
Cholesterol: 249 mg/dL — ABNORMAL HIGH (ref <200)
HDL: 82 mg/dL (ref >=50)
LDL Cholesterol (Calc): 147 mg/dL (calc) — ABNORMAL HIGH
Non-HDL Cholesterol (Calc): 167 mg/dL (calc) — ABNORMAL HIGH (ref <130)
Total CHOL/HDL Ratio: 3 (calc) (ref <5.0)
Triglycerides: 95 mg/dL (ref <150)

## 2023-03-16 LAB — COMPREHENSIVE METABOLIC PANEL
AG Ratio: 1.7 (calc) (ref 1.0–2.5)
ALT: 19 U/L (ref 6–29)
AST: 24 U/L (ref 10–35)
Albumin: 4.6 g/dL (ref 3.6–5.1)
Alkaline phosphatase (APISO): 52 U/L (ref 37–153)
BUN: 14 mg/dL (ref 7–25)
CO2: 27 mmol/L (ref 20–32)
Calcium: 9.3 mg/dL (ref 8.6–10.4)
Chloride: 102 mmol/L (ref 98–110)
Creat: 0.91 mg/dL (ref 0.50–1.03)
Globulin: 2.7 g/dL (calc) (ref 1.9–3.7)
Glucose, Bld: 76 mg/dL (ref 65–99)
Potassium: 4 mmol/L (ref 3.5–5.3)
Sodium: 136 mmol/L (ref 135–146)
Total Bilirubin: 0.9 mg/dL (ref 0.2–1.2)
Total Protein: 7.3 g/dL (ref 6.1–8.1)

## 2023-03-16 LAB — VITAMIN D 25 HYDROXY (VIT D DEFICIENCY, FRACTURES): Vit D, 25-Hydroxy: 27 ng/mL — ABNORMAL LOW (ref 30–100)

## 2023-05-14 ENCOUNTER — Other Ambulatory Visit (HOSPITAL_COMMUNITY): Payer: Self-pay

## 2023-08-24 DIAGNOSIS — H524 Presbyopia: Secondary | ICD-10-CM | POA: Diagnosis not present

## 2023-10-02 ENCOUNTER — Other Ambulatory Visit (HOSPITAL_COMMUNITY): Payer: Self-pay

## 2023-10-02 ENCOUNTER — Telehealth: Payer: 59 | Admitting: Family Medicine

## 2023-10-02 DIAGNOSIS — L539 Erythematous condition, unspecified: Secondary | ICD-10-CM

## 2023-10-02 DIAGNOSIS — W5501XA Bitten by cat, initial encounter: Secondary | ICD-10-CM | POA: Diagnosis not present

## 2023-10-02 DIAGNOSIS — S61451A Open bite of right hand, initial encounter: Secondary | ICD-10-CM

## 2023-10-02 MED ORDER — AMOXICILLIN-POT CLAVULANATE 875-125 MG PO TABS
1.0000 | ORAL_TABLET | Freq: Two times a day (BID) | ORAL | 0 refills | Status: AC
Start: 2023-10-02 — End: 2023-10-09
  Filled 2023-10-02 (×2): qty 14, 7d supply, fill #0

## 2023-10-02 NOTE — Progress Notes (Signed)
E-Visit for Simple Cut/Laceration  We are sorry that you have had an injury. Here is how we plan to help!  Cat bites, even ones by play are at risk for infection. Just due to the bacteria that lives in their mouth and our skin. Especially if a puncture occurs.  We will need to place you on antibiotics for this.   Augmentin Twice Daily for 7 days- you need to continue the antibiotic 1-2 days after symptoms and signs resolve. So a full 7 days is desired.   HOME CARE: Clean the cut or scrape - Wash it well with soap and water. * avoid using hydrogen peroxide which may cause tissue damage, or impede wound healing.  Stop the bleeding - If your cut or scrape is bleeding, press a clean cloth or bandage firmly on the area for 20 minutes. You can also help slow the bleeding by holding the cut above the level of your heart.   Put a thin layer of Bacitracin antibiotic ointment on the cut or scrape. (this can be purchased at any local pharmacy- ask your pharmacist if you need assistance)   Cover the cut or scrape with a bandage or gauze. Keep the bandage clean and dry. Change the bandage 1 to 2 times every day until your cut or scrape heals.   Watch for signs that your cut or scrape is infected (redness, drainage, pain, warmth, swelling or fever)  Over the next 48 hours your wound should start to improve with less pain, less swelling and less redness. If you should develop increasing pain, swelling, redness, fever, pus from the wound you should be seen immediately to make sure this is not becoming infected.   WOUND CARE: Please keep a layer of antibiotic ointment (bacitracin preferred) on this wound at least twice a day for the next seven days and keep a sterile dressing over top of it. You may gently clean the wound with warm soap and water between dressing changes.  We strongly recommend that you have a medical provider reevaluate your wound within 2 to 3 days in person to make sure that it is healing  appropriately.  Thank you for choosing an e-visit.  Your e-visit answers were reviewed by a board certified advanced clinical practitioner to complete your personal care plan. Depending upon the condition, your plan could have included both over the counter or prescription medications.  Please review your pharmacy choice. Make sure the pharmacy is open so you can pick up prescription now. If there is a problem, you may contact your provider through [employer] and have the prescription routed to another pharmacy.  Your safety is important to [place]. If you have drug allergies check your prescription carefully.   For the next 24 hours you can use MyChart to ask questions about today's visit, request a non-urgent call back, or ask for a work or school excuse. You will get an email in the next two days asking about your experience. I hope that your e-visit has been valuable and will speed your recovery.   I provided 5 minutes of non face-to-face time during this encounter for chart review, medication and order placement, as well as and documentation.

## 2023-10-05 ENCOUNTER — Other Ambulatory Visit: Payer: Self-pay | Admitting: Obstetrics and Gynecology

## 2023-10-05 DIAGNOSIS — Z1231 Encounter for screening mammogram for malignant neoplasm of breast: Secondary | ICD-10-CM

## 2023-10-17 ENCOUNTER — Ambulatory Visit
Admission: RE | Admit: 2023-10-17 | Discharge: 2023-10-17 | Disposition: A | Discharge status: Home / Self Care | Payer: 59 | Source: Ambulatory Visit | Attending: Obstetrics and Gynecology | Admitting: Obstetrics and Gynecology

## 2023-10-17 DIAGNOSIS — Z1231 Encounter for screening mammogram for malignant neoplasm of breast: Secondary | ICD-10-CM | POA: Diagnosis not present

## 2023-10-20 ENCOUNTER — Encounter: Payer: Self-pay | Admitting: Obstetrics and Gynecology

## 2023-10-28 NOTE — Progress Notes (Unsigned)
 No chief complaint on file.   Patient presents to establish care. She is under the care of Dr. Edward Jolly, who has performed her lab studies as well. She was encouraged to establish with a PCP due to elevated cholesterol.   Hyperlipidemia: Last lipids showed LDL 147 in July 2024, with excellent HDL, normal ratio.  Diet:          Component Ref Range & Units (hover) 7 mo ago 1 yr ago 2 yr ago 3 yr ago 6 yr ago 7 yr ago  Cholesterol 249 High  233 High  209 High   186 188 R  HDL 82 83 79 61 R 80 R 69 R  Triglycerides 95 110 128 158 High  R 65 126  LDL Cholesterol (Calc) 147 High  128 High  CM 106 High  CM       Total CHOL/HDL Ratio 3.0 2.8 2.6 3.0 R, CM 2.3 R 2.7 R  Non-HDL Cholesterol (Calc) 167 High  150 High  CM 130 High  CM        Vitamin D deficiency: Last level checked in 03/2023 by GYN. She was encouraged to start D3 1000 IU daily.  Component Ref Range & Units (hover) 7 mo ago 1 yr ago 2 yr ago 3 yr ago 6 yr ago  Vit D, 25-Hydroxy 27 Low  38 CM 28 Low  CM 24.7 Low  R, CM 30 CM   '  Chart review-- Lipids and vitamin D per above. Had normal c-met 03/2023. Had labs 11/2022 showing elevated FSH and low estradiol (postmenopausal), normal TSH, testosterone, CBC and iron.  Normal mammo 10/2023 Negative Cologuard 03/2022 Last pap 02/2019     ROS:    PHYSICAL EXAM:  There were no vitals taken for this visit.  Wt Readings from Last 3 Encounters:  03/15/23 118 lb (53.5 kg)  12/26/22 116 lb (52.6 kg)  11/13/22 116 lb (52.6 kg)       ASSESSMENT/PLAN:  Imms from [employer] ([employer] employee--should have flu, and at least some COVID) Shingrix?  Had cologuard in 2023--please make it so that colonoscopy isn't coming up as gap.   Arrange for f/u lipids--can also check D. Ask about HIV, HepC to be done with those  Will be due for pap with GYN this summer (no appt yet scheduled with Dr. Edward Jolly)

## 2023-10-29 ENCOUNTER — Encounter: Payer: Self-pay | Admitting: Family Medicine

## 2023-10-29 ENCOUNTER — Ambulatory Visit: Payer: 59 | Admitting: Family Medicine

## 2023-10-29 VITALS — BP 120/70 | HR 68 | Ht 63.0 in | Wt 127.2 lb

## 2023-10-29 DIAGNOSIS — Z114 Encounter for screening for human immunodeficiency virus [HIV]: Secondary | ICD-10-CM

## 2023-10-29 DIAGNOSIS — Z0000 Encounter for general adult medical examination without abnormal findings: Secondary | ICD-10-CM

## 2023-10-29 DIAGNOSIS — R635 Abnormal weight gain: Secondary | ICD-10-CM | POA: Diagnosis not present

## 2023-10-29 DIAGNOSIS — Z1159 Encounter for screening for other viral diseases: Secondary | ICD-10-CM

## 2023-10-29 DIAGNOSIS — E7800 Pure hypercholesterolemia, unspecified: Secondary | ICD-10-CM | POA: Diagnosis not present

## 2023-10-29 DIAGNOSIS — E559 Vitamin D deficiency, unspecified: Secondary | ICD-10-CM

## 2023-10-29 DIAGNOSIS — Z7185 Encounter for immunization safety counseling: Secondary | ICD-10-CM | POA: Diagnosis not present

## 2023-10-29 NOTE — Patient Instructions (Addendum)
 Schedule your yearly visit with Dr. Edward Jolly (due in July)--you are due for a pap, and likely for your IUD to be removed.  Eat more fruit and vegetables, and cut back on the cheese.  Try and have more milk (either nonfat/lowfat, or a non-dairy milk such as soy or almond milk) in order to get your calcium.  Try and limit alcohol to no more than 1 drink/day. Try and come up with a different wind-down/relaxation routine for the evening (ie walks after dinner when it is light/warm).

## 2024-02-05 ENCOUNTER — Other Ambulatory Visit (HOSPITAL_COMMUNITY)
Admission: RE | Admit: 2024-02-05 | Discharge: 2024-02-05 | Disposition: A | Discharge status: Home / Self Care | Payer: Self-pay | Source: Ambulatory Visit | Attending: Medical Genetics | Admitting: Medical Genetics

## 2024-02-05 DIAGNOSIS — Z006 Encounter for examination for normal comparison and control in clinical research program: Secondary | ICD-10-CM | POA: Insufficient documentation

## 2024-02-12 LAB — GENECONNECT MOLECULAR SCREEN: Genetic Analysis Overall Interpretation: NEGATIVE

## 2024-02-13 ENCOUNTER — Other Ambulatory Visit (HOSPITAL_COMMUNITY): Payer: Self-pay

## 2024-02-25 ENCOUNTER — Other Ambulatory Visit: Payer: 59

## 2024-02-25 DIAGNOSIS — Z114 Encounter for screening for human immunodeficiency virus [HIV]: Secondary | ICD-10-CM

## 2024-02-25 DIAGNOSIS — E559 Vitamin D deficiency, unspecified: Secondary | ICD-10-CM

## 2024-02-25 DIAGNOSIS — Z1159 Encounter for screening for other viral diseases: Secondary | ICD-10-CM

## 2024-02-25 DIAGNOSIS — E7800 Pure hypercholesterolemia, unspecified: Secondary | ICD-10-CM | POA: Diagnosis not present

## 2024-02-25 DIAGNOSIS — Z0000 Encounter for general adult medical examination without abnormal findings: Secondary | ICD-10-CM | POA: Diagnosis not present

## 2024-02-25 LAB — LIPID PANEL

## 2024-02-26 LAB — CBC WITH DIFFERENTIAL/PLATELET
Basophils Absolute: 0.1 10*3/uL (ref 0.0–0.2)
Basos: 2 %
EOS (ABSOLUTE): 0.1 10*3/uL (ref 0.0–0.4)
Eos: 1 %
Hematocrit: 39.6 % (ref 34.0–46.6)
Hemoglobin: 13.1 g/dL (ref 11.1–15.9)
Immature Grans (Abs): 0 10*3/uL (ref 0.0–0.1)
Immature Granulocytes: 0 %
Lymphocytes Absolute: 1.6 10*3/uL (ref 0.7–3.1)
Lymphs: 33 %
MCH: 30.5 pg (ref 26.6–33.0)
MCHC: 33.1 g/dL (ref 31.5–35.7)
MCV: 92 fL (ref 79–97)
Monocytes Absolute: 0.4 10*3/uL (ref 0.1–0.9)
Monocytes: 9 %
Neutrophils Absolute: 2.6 10*3/uL (ref 1.4–7.0)
Neutrophils: 55 %
Platelets: 223 10*3/uL (ref 150–450)
RBC: 4.3 x10E6/uL (ref 3.77–5.28)
RDW: 12.5 % (ref 11.7–15.4)
WBC: 4.8 10*3/uL (ref 3.4–10.8)

## 2024-02-26 LAB — LIPID PANEL
Cholesterol, Total: 251 mg/dL — ABNORMAL HIGH (ref 100–199)
HDL: 83 mg/dL (ref >39)
LDL CALC COMMENT:: 3 ratio (ref 0.0–4.4)
LDL Chol Calc (NIH): 153 mg/dL — ABNORMAL HIGH (ref 0–99)
Triglycerides: 88 mg/dL (ref 0–149)
VLDL Cholesterol Cal: 15 mg/dL (ref 5–40)

## 2024-02-26 LAB — COMPREHENSIVE METABOLIC PANEL WITH GFR
ALT: 15 IU/L (ref 0–32)
AST: 24 IU/L (ref 0–40)
Albumin: 4.6 g/dL (ref 3.8–4.9)
Alkaline Phosphatase: 60 IU/L (ref 44–121)
BUN/Creatinine Ratio: 16 (ref 9–23)
BUN: 15 mg/dL (ref 6–24)
Bilirubin Total: 0.6 mg/dL (ref 0.0–1.2)
CO2: 21 mmol/L (ref 20–29)
Calcium: 9.3 mg/dL (ref 8.7–10.2)
Chloride: 101 mmol/L (ref 96–106)
Creatinine, Ser: 0.91 mg/dL (ref 0.57–1.00)
Globulin, Total: 2.3 g/dL (ref 1.5–4.5)
Glucose: 87 mg/dL (ref 70–99)
Potassium: 4.1 mmol/L (ref 3.5–5.2)
Sodium: 137 mmol/L (ref 134–144)
Total Protein: 6.9 g/dL (ref 6.0–8.5)
eGFR: 76 mL/min/{1.73_m2} (ref >59)

## 2024-02-26 LAB — VITAMIN D 25 HYDROXY (VIT D DEFICIENCY, FRACTURES): Vit D, 25-Hydroxy: 36.5 ng/mL (ref 30.0–100.0)

## 2024-02-26 LAB — HIV ANTIBODY (ROUTINE TESTING W REFLEX)

## 2024-02-26 LAB — HEPATITIS C ANTIBODY

## 2024-02-26 NOTE — Progress Notes (Unsigned)
 No chief complaint on file.  Stacey Jordan is a 53 y.o. female who presents for a complete physical.  See below for labs done prior to visit.  She has the following concerns:   She was started on Paxil  in 11/2022 (wasn't sleeping well, night sweats, emotions all over.)  She preferred Paxil  over use of hormones. Hot flashes improved some, as did her moods. She denies side effects.  She also used EAP and coaching through work.   When seen in 10/2023, she reported craving sweets, and drinking more alcohol (2/d) since the holidays, and noted weight gain since the Fall. ***   Hyperlipidemia: Last lipids showed LDL 147 in July 2024, with excellent HDL, normal ratio. LDL was higher than prior years, per below) LDL on recent labs was up to 153.  ***UPDATE DIET Red meat: pork loin 2-3x/week, some chicken.  Fish only every other week. Occasional burger or lean steak. +cheese (on things, and as snacks). No butter or mayo. Vinaigrette dressings, olive oil. No creamy soups or sauces. Eggs:  averages 4/week. Occasional oatmeal, yogurt with granola.   Component Ref Range & Units (hover) 1 d ago 11 mo ago 1 yr ago 3 yr ago 4 yr ago 7 yr ago 8 yr ago  Cholesterol, Total 251 High     184    Triglycerides 88 95 R 110 R 128 R 158 High  65 R 126 R  HDL 83 82 R 83 R 79 R 61 80 R 69 R  VLDL Cholesterol Cal 15    27    LDL Chol Calc (NIH) 153 High  147 128 106 96    Chol/HDL Ratio 3.0 3.0 R 2.8 R 2.6 R 3.0 CM 2.3 R 2.7 R   Vitamin D  deficiency: Low in 03/2023 when checked by GYN. She hadn't been taking a supplement regularly prior to that visit.  GYN advised her to start 1000 IU daily. She is currently taking *** Repeat level was within normal range.   Component Ref Range & Units (hover) 1 d ago 11 mo ago 1 yr ago 3 yr ago 4 yr ago 7 yr ago  Vit D, 25-Hydroxy 36.5 27 Low  R, CM 38 R, CM 28 Low  R, CM 24.7 Low  CM 30 R, CM      Immunization History  Administered Date(s) Administered    Influenza-Unspecified 06/22/2020, 06/05/2023   PFIZER(Purple Top)SARS-COV-2 Vaccination 08/25/2019, 09/17/2019, 06/11/2020   Tdap 01/20/2016   Last Pap smear: 02/2019 Last mammogram: 10/2023 Last colonoscopy: never. Cologuard negative in 03/2022 Last DEXA: Dentist: Ophtho: Exercise:   PMH, PSH, SH and FH were reviewed and updated   Review of Systems  Constitutional: Negative.  Negative for chills, fever and weight loss.  HENT:  Negative for congestion and hearing loss.   Eyes: Negative.   Respiratory:  Negative for cough, shortness of breath and wheezing.   Cardiovascular:  Negative for chest pain, palpitations and leg swelling.  Gastrointestinal:  Negative for abdominal pain, blood in stool, constipation, diarrhea, melena, nausea and vomiting.  Genitourinary:  Negative for dysuria, frequency, hematuria and urgency.  Musculoskeletal:  Negative for joint pain.  Skin:  Negative for rash.  Neurological:  Negative for dizziness, tingling, tremors, weakness and headaches.  Endo/Heme/Allergies:  Negative for environmental allergies. Does not bruise/bleed easily.  Psychiatric/Behavioral:  Negative for depression and memory loss. The patient is not nervous/anxious and does not have insomnia.     ***UPDATE ALL   PHYSICAL EXAM:  There  were no vitals taken for this visit.  Wt Readings from Last 3 Encounters:  10/29/23 127 lb 3.2 oz (57.7 kg)  03/15/23 118 lb (53.5 kg)  12/26/22 116 lb (52.6 kg)   There were no vitals taken for this visit.  General Appearance:    Alert, cooperative, no distress, appears stated age  Head:    Normocephalic, without obvious abnormality, atraumatic  Eyes:    PERRL, conjunctiva/corneas clear, EOM's intact, fundi    benign  Ears:    Normal TM's and external ear canals  Nose:   Nares normal, mucosa normal, no drainage or sinus   tenderness  Throat:   Lips, mucosa, and tongue normal; teeth and gums normal  Neck:   Supple, no lymphadenopathy;  thyroid :   no enlargement/ tenderness/nodules; no carotid bruit or JVD  Back:    Spine nontender, no curvature, ROM normal, no CVA     tenderness  Lungs:     Clear to auscultation bilaterally without wheezes, rales or     ronchi; respirations unlabored  Chest Wall:    No tenderness or deformity   Heart:    Regular rate and rhythm, S1 and S2 normal, no murmur, rub   or gallop  Breast Exam:    Deferred to GYN  Abdomen:     Soft, non-tender, nondistended, normoactive bowel sounds,    no masses, no hepatosplenomegaly  Genitalia:    Deferred to GYN     Extremities:   No clubbing, cyanosis or edema  Pulses:   2+ and symmetric all extremities  Skin:   Skin color, texture, turgor normal, no rashes or lesions  Lymph nodes:   Cervical, supraclavicular, and axillary nodes normal  Neurologic:   CNII-XII intact, normal strength, sensation and gait; reflexes 2+ and symmetric throughout          Psych:   Normal mood, affect, hygiene and grooming.      Lab Results  Component Value Date   CHOL 251 (H) 02/25/2024   HDL 83 02/25/2024   LDLCALC 153 (H) 02/25/2024   TRIG 88 02/25/2024   CHOLHDL 3.0 02/25/2024   Vitamin D -OH 36.5  Fasting glucose 87   Chemistry      Component Value Date/Time   NA 137 02/25/2024 0817   K 4.1 02/25/2024 0817   CL 101 02/25/2024 0817   CO2 21 02/25/2024 0817   BUN 15 02/25/2024 0817   CREATININE 0.91 02/25/2024 0817   CREATININE 0.91 03/15/2023 0942      Component Value Date/Time   CALCIUM 9.3 02/25/2024 0817   ALKPHOS 60 02/25/2024 0817   AST 24 02/25/2024 0817   ALT 15 02/25/2024 0817   BILITOT 0.6 02/25/2024 0817     Lab Results  Component Value Date   WBC 4.8 02/25/2024   HGB 13.1 02/25/2024   HCT 39.6 02/25/2024   MCV 92 02/25/2024   PLT 223 02/25/2024   Negative HIV and Hep C Ab    ASSESSMENT/PLAN:   Likely shouldn't do prevnar and shingrix together--  Needs to schedule f/u with Dr. Nikki (due in July)  Discussed monthly self breast exams and  yearly mammograms after the age of 27; at least 30 minutes of aerobic activity at least 5 days/week, weight-bearing exercise at least 2x/week; proper sunscreen use reviewed; healthy diet, including goals of calcium and vitamin D  intake and alcohol recommendations (less than or equal to 1 drink/day) reviewed; regular seatbelt use; changing batteries in smoke detectors.  Immunization recommendations discussed--continue yearly flu  shots. Prevnar-20 *** Shingrix *** COVID boosters discussed Colon cancer screening recommendations reviewed, Cologuard due again 03/2025.

## 2024-02-26 NOTE — Patient Instructions (Incomplete)
   HEALTH MAINTENANCE RECOMMENDATIONS:  It is recommended that you get at least 30 minutes of aerobic exercise at least 5 days/week (for weight loss, you may need as much as 60-90 minutes). This can be any activity that gets your heart rate up. This can be divided in 10-15 minute intervals if needed, but try and build up your endurance at least once a week.  Weight bearing exercise is also recommended twice weekly.  Eat a healthy diet with lots of vegetables, fruits and fiber.  "Colorful" foods have a lot of vitamins (ie green vegetables, tomatoes, red peppers, etc).  Limit sweet tea, regular sodas and alcoholic beverages, all of which has a lot of calories and sugar.  Up to 1 alcoholic drink daily may be beneficial for women (unless trying to lose weight, watch sugars).  Drink a lot of water.  Calcium recommendations are 1200-1500 mg daily (1500 mg for postmenopausal women or women without ovaries), and vitamin D 1000 IU daily.  This should be obtained from diet and/or supplements (vitamins), and calcium should not be taken all at once, but in divided doses.  Monthly self breast exams and yearly mammograms for women over the age of 109 is recommended.  Sunscreen of at least SPF 30 should be used on all sun-exposed parts of the skin when outside between the hours of 10 am and 4 pm (not just when at beach or pool, but even with exercise, golf, tennis, and yard work!)  Use a sunscreen that says "broad spectrum" so it covers both UVA and UVB rays, and make sure to reapply every 1-2 hours.  Remember to change the batteries in your smoke detectors when changing your clock times in the spring and fall. Carbon monoxide detectors are recommended for your home.  Use your seat belt every time you are in a car, and please drive safely and not be distracted with cell phones and texting while driving.

## 2024-02-27 ENCOUNTER — Ambulatory Visit: Payer: 59 | Admitting: Family Medicine

## 2024-02-27 ENCOUNTER — Other Ambulatory Visit (HOSPITAL_COMMUNITY): Payer: Self-pay

## 2024-02-27 ENCOUNTER — Encounter: Payer: Self-pay | Admitting: Family Medicine

## 2024-02-27 VITALS — BP 110/64 | HR 68 | Ht 63.0 in | Wt 128.6 lb

## 2024-02-27 DIAGNOSIS — E7800 Pure hypercholesterolemia, unspecified: Secondary | ICD-10-CM | POA: Diagnosis not present

## 2024-02-27 DIAGNOSIS — E559 Vitamin D deficiency, unspecified: Secondary | ICD-10-CM | POA: Diagnosis not present

## 2024-02-27 DIAGNOSIS — N951 Menopausal and female climacteric states: Secondary | ICD-10-CM | POA: Diagnosis not present

## 2024-02-27 DIAGNOSIS — Z8249 Family history of ischemic heart disease and other diseases of the circulatory system: Secondary | ICD-10-CM

## 2024-02-27 DIAGNOSIS — Z0000 Encounter for general adult medical examination without abnormal findings: Secondary | ICD-10-CM

## 2024-02-27 MED ORDER — PAROXETINE HCL 20 MG PO TABS
20.0000 mg | ORAL_TABLET | Freq: Every day | ORAL | 0 refills | Status: DC
  Filled 2024-02-27: qty 90, 90d supply, fill #0

## 2024-02-29 ENCOUNTER — Ambulatory Visit: Payer: Self-pay | Admitting: Family Medicine

## 2024-02-29 LAB — SPECIMEN STATUS REPORT

## 2024-02-29 LAB — LIPOPROTEIN A (LPA): Lipoprotein (a): <8.4 nmol/L (ref <75.0)

## 2024-03-20 ENCOUNTER — Other Ambulatory Visit (HOSPITAL_BASED_OUTPATIENT_CLINIC_OR_DEPARTMENT_OTHER)

## 2024-03-21 ENCOUNTER — Other Ambulatory Visit (HOSPITAL_BASED_OUTPATIENT_CLINIC_OR_DEPARTMENT_OTHER)

## 2024-03-25 ENCOUNTER — Ambulatory Visit: Payer: Self-pay | Admitting: Family Medicine

## 2024-03-25 ENCOUNTER — Ambulatory Visit (HOSPITAL_BASED_OUTPATIENT_CLINIC_OR_DEPARTMENT_OTHER)
Admission: RE | Admit: 2024-03-25 | Discharge: 2024-03-25 | Disposition: A | Discharge status: Home / Self Care | Payer: Self-pay | Source: Ambulatory Visit | Attending: Family Medicine | Admitting: Family Medicine

## 2024-03-25 DIAGNOSIS — Z8249 Family history of ischemic heart disease and other diseases of the circulatory system: Secondary | ICD-10-CM | POA: Insufficient documentation

## 2024-03-25 DIAGNOSIS — E7800 Pure hypercholesterolemia, unspecified: Secondary | ICD-10-CM | POA: Insufficient documentation

## 2024-05-08 NOTE — Progress Notes (Unsigned)
 53 y.o. G26P2002 Married Caucasian female here for annual exam.    She is followed for menopause symptoms and vaginal atrophy.  She was started on Paxil  10 mg for treating vasomotor symptoms and stress. Emotions are under control but can feel restless at night at times.  Still has hot flashes.  PCP increased her Paxil  to 20 mg to take at hs, and she has not started this yet.  She has some hesitation about increasing the dosage.  Asking about HRT, which she is considering.   Notes weight change.  Sex is now painful.  She is using estradiol  cream on the perineum.    Has a sore of her bottom.   States she had a vagal response with IUD care in the past.   PCP: Randol Dawes, MD   No LMP recorded. (Menstrual status: IUD).           Sexually active: Yes.    The current method of family planning is IUD-Mirena  IUD inserted 2018.    Menopausal hormone therapy:  estrace  cream. Exercising: Yes.    Yoga and yard work   Smoker:  no  OB History  Gravida Para Term Preterm AB Living  2 2 2   2   SAB IAB Ectopic Multiple Live Births          # Outcome Date GA Lbr Len/2nd Weight Sex Type Anes PTL Lv  2 Term           1 Term              HEALTH MAINTENANCE: Last 2 paps:  02/18/19 neg HPV neg History of abnormal Pap or positive HPV:  no Mammogram:   10/17/23 Breast Density Cat C, BIRADS Cat 1 neg  Colonoscopy:  cologuard 2023  Bone Density:  n/a  Result  n/a   Immunization History  Administered Date(s) Administered   Influenza-Unspecified 06/22/2020, 06/05/2023   PFIZER(Purple Top)SARS-COV-2 Vaccination 08/25/2019, 09/17/2019, 06/11/2020   Tdap 01/20/2016      reports that she has never smoked. She has never used smokeless tobacco. She reports that she does not currently use alcohol. She reports that she does not use drugs.  Past Medical History:  Diagnosis Date   Complication of anesthesia    very sensitive to aneth and pain meds   DDD (degenerative disc disease), lumbar     Elevated LDL cholesterol level    History of COVID-19 01/02/2021   Medical history non-contributory    PONV (postoperative nausea and vomiting)     Past Surgical History:  Procedure Laterality Date   BREAST BIOPSY Left 02/03/2016   U/S- Benign   BREAST BIOPSY  04/24/2007   U/S Core- Benign   BREAST CYST ASPIRATION Left    BREAST EXCISIONAL BIOPSY Left 02/23/2016   benign   BREAST SURGERY  2005   Benign Rt.breast Bx   EXCISION METACARPAL MASS Right 01/07/2020   Procedure: Right index finger and right long finger mass excision;  Surgeon: Shari Easter, MD;  Location: [facility];  Service: Orthopedics;  Laterality: Right;  with local anesthesia   HEMILAMINOTOMY LUMBAR SPINE Left 06/03/2008   L5-S1; with microdiscectomy and medial facetectomy   MASS EXCISION Right 04/03/2013   Procedure:  MASS EXCISION AND BIOPSY;  Surgeon: Easter LELON Shari, MD;  Location: [facility];  Service: Orthopedics;  Laterality: Right;   RADIOACTIVE SEED GUIDED EXCISIONAL BREAST BIOPSY Left 02/24/2016   Procedure: RADIOACTIVE SEED GUIDED EXCISIONAL LEFT BREAST BIOPSY;  Surgeon: Donnice  Ebbie, MD;  Location: [facility];  Service: General;  Laterality: Left;  RADIOACTIVE SEED GUIDED EXCISIONAL LEFT BREAST BIOPSY   WISDOM TOOTH EXTRACTION      Current Outpatient Medications  Medication Sig Dispense Refill   Cholecalciferol 25 MCG (1000 UT) capsule      cyanocobalamin (VITAMIN B12) 1000 MCG tablet Take 1,000 mcg by mouth daily.     estradiol  (ESTRACE ) 0.1 MG/GM vaginal cream Use 1/2 g vaginally every night for the first 2 weeks, then use 1/2 g vaginally two or three times per week as needed to maintain symptom relief. 42.5 g 2   levonorgestrel  (MIRENA ) 20 MCG/24HR IUD 1 each by Intrauterine route once.     PARoxetine  (PAXIL ) 20 MG tablet Take 1 tablet (20 mg total) by mouth at bedtime. 90 tablet 0   Multiple Vitamin (MULTI-VITAMIN) tablet  (Patient not taking: Reported on  05/12/2024)     No current facility-administered medications for this visit.    ALLERGIES: Patient has no known allergies.  Family History  Problem Relation Age of Onset   GER disease Mother        multiple strictures, treated   CAD Father 52   Hypertension Father    Hyperlipidemia Father    Alcohol abuse Father    Arthritis Father    Hyperlipidemia Brother    Stroke Paternal Grandmother    Scoliosis Daughter        treated with brace   GER disease Son     Review of Systems  All other systems reviewed and are negative.   PHYSICAL EXAM:  BP 110/70 (BP Location: Left Arm, Patient Position: Sitting)   Pulse 95   Ht 5' 4 (1.626 m)   Wt 126 lb (57.2 kg)   SpO2 97%   BMI 21.63 kg/m     General appearance: alert, cooperative and appears stated age Head: normocephalic, without obvious abnormality, atraumatic Neck: no adenopathy, supple, symmetrical, trachea midline and thyroid  normal to inspection and palpation Lungs: clear to auscultation bilaterally Breasts: normal appearance, no masses or tenderness, No nipple retraction or dimpling, No nipple discharge or bleeding, No axillary adenopathy Heart: regular rate and rhythm Abdomen: soft, non-tender; no masses, no organomegaly Extremities: extremities normal, atraumatic, no cyanosis or edema Skin: skin color, texture, turgor normal. No rashes or lesions Lymph nodes: cervical, supraclavicular, and axillary nodes normal. Neurologic: grossly normal  Pelvic: External genitalia:  no lesions              No abnormal inguinal nodes palpated.              Urethra:  normal appearing urethra with no masses, tenderness or lesions              Bartholins and Skenes: normal                 Vagina: normal appearing vagina with normal color and discharge, no lesions              Cervix: no lesions.  IUD strings noted.               Pap taken: yes Bimanual Exam:  Uterus:  normal size, contour, position, consistency, mobility, non-tender               Adnexa: no mass, fullness, tenderness              Rectal exam: yes.  Confirms.  Left perianal region with  7 - 8 mm draining abscess  noted.               Anus:  normal sphincter tone, no lesions  Chaperone was present for exam:  Zada PARAS, CMA  ASSESSMENT: Well woman visit with gynecologic exam. Mirena  IUD.  Placed in 2018.  Menopausal symptoms.  Vaginal atrophy.  Cervical cancer screening.  Status post left breast biopsy. Hx GSI.  PHQ-2-9: 0 Left perianal abscess.   PLAN: Mammogram screening discussed. Self breast awareness reviewed. Pap and HRV collected:  yes Guidelines for Calcium, Vitamin D , regular exercise program including cardiovascular and weight bearing exercise. Medication refills:  Paxil  10 mg daily.  #90, RF 3.  Estradiol  cream 1 gram pv and to the perineum at hs twice a week.  Discused WHI and use of HRT which can increase risk of PE, DVT, MI, stroke and breast cancer.  Benefits of treating vasomotor symptoms, improving vaginal health, reducing risk of colon cancer, and improving quality of life.  HRT:  Vivelle  Dot 0.05 mg twice daily.  #24, RF none.  Prometrium  100 mg q hs #90, RF 0. Bactrim  DS po bid x 7 days.  Labs with PCP.  Follow up:  8 weeks for HRT recheck and IUD removal.  May consider paracervical block for IUD removal.

## 2024-05-12 ENCOUNTER — Ambulatory Visit (INDEPENDENT_AMBULATORY_CARE_PROVIDER_SITE_OTHER): Admitting: Obstetrics and Gynecology

## 2024-05-12 ENCOUNTER — Other Ambulatory Visit (HOSPITAL_COMMUNITY)
Admission: RE | Admit: 2024-05-12 | Discharge: 2024-05-12 | Disposition: A | Discharge status: Home / Self Care | Source: Ambulatory Visit | Attending: Obstetrics and Gynecology | Admitting: Obstetrics and Gynecology

## 2024-05-12 ENCOUNTER — Encounter: Payer: Self-pay | Admitting: Obstetrics and Gynecology

## 2024-05-12 ENCOUNTER — Other Ambulatory Visit (HOSPITAL_COMMUNITY): Payer: Self-pay

## 2024-05-12 VITALS — BP 110/70 | HR 95 | Ht 64.0 in | Wt 126.0 lb

## 2024-05-12 DIAGNOSIS — Z124 Encounter for screening for malignant neoplasm of cervix: Secondary | ICD-10-CM

## 2024-05-12 DIAGNOSIS — Z780 Asymptomatic menopausal state: Secondary | ICD-10-CM

## 2024-05-12 DIAGNOSIS — L0231 Cutaneous abscess of buttock: Secondary | ICD-10-CM

## 2024-05-12 DIAGNOSIS — Z01419 Encounter for gynecological examination (general) (routine) without abnormal findings: Secondary | ICD-10-CM

## 2024-05-12 DIAGNOSIS — N951 Menopausal and female climacteric states: Secondary | ICD-10-CM | POA: Diagnosis not present

## 2024-05-12 DIAGNOSIS — Z1331 Encounter for screening for depression: Secondary | ICD-10-CM

## 2024-05-12 MED ORDER — SULFAMETHOXAZOLE-TRIMETHOPRIM 800-160 MG PO TABS
1.0000 | ORAL_TABLET | Freq: Two times a day (BID) | ORAL | 0 refills | Status: AC
  Filled 2024-05-12: qty 14, 7d supply, fill #0

## 2024-05-12 MED ORDER — PAROXETINE HCL 10 MG PO TABS
10.0000 mg | ORAL_TABLET | ORAL | 3 refills | Status: AC
  Filled 2024-05-12: qty 90, 90d supply, fill #0
  Filled 2024-08-12: qty 90, 90d supply, fill #1

## 2024-05-12 MED ORDER — PROGESTERONE MICRONIZED 100 MG PO CAPS
100.0000 mg | ORAL_CAPSULE | Freq: Every day | ORAL | 0 refills | Status: DC
  Filled 2024-05-12: qty 90, 90d supply, fill #0

## 2024-05-12 MED ORDER — PAROXETINE HCL 20 MG PO TABS
ORAL_TABLET | ORAL | 0 refills | Status: DC
  Filled 2024-05-12: qty 90, fill #0

## 2024-05-12 MED ORDER — ESTRADIOL 0.1 MG/GM VA CREA
TOPICAL_CREAM | VAGINAL | 2 refills | Status: AC
  Filled 2024-05-12: qty 42.5, 90d supply, fill #0

## 2024-05-12 MED ORDER — ESTRADIOL 0.05 MG/24HR TD PTTW
1.0000 | MEDICATED_PATCH | TRANSDERMAL | 0 refills | Status: DC
  Filled 2024-05-12: qty 24, 84d supply, fill #0

## 2024-05-12 NOTE — Patient Instructions (Signed)

## 2024-05-14 ENCOUNTER — Ambulatory Visit: Payer: Self-pay | Admitting: Obstetrics and Gynecology

## 2024-05-14 LAB — CYTOLOGY - PAP
Comment: NEGATIVE
Diagnosis: NEGATIVE
High risk HPV: NEGATIVE

## 2024-07-08 NOTE — Progress Notes (Unsigned)
 GYNECOLOGY  VISIT   HPI: 53 y.o.   Married  Caucasian female   G2P2002 with No LMP recorded. (Menstrual status: IUD).   here for: 8 week follow up and IUD Removal - Estrace , Vivelle , Prometrium . Mirena  IUD.    Feeling better on her HRT regimen. Hot flashes and night sweats are gone.  Sleeping better.   Taking Prometrium  nightly.  Notes more gas.   No constipation.     Some weight and body change.   Not exercising regularly.  Vaginal estrogen is working well.   UPT neg.   GYNECOLOGIC HISTORY: No LMP recorded. (Menstrual status: IUD). Contraception:  Mirena  IUD inserted 2018  Menopausal hormone therapy:  estrace  and vivelle   Last 2 paps:  05/12/24 neg, HR HPV neg, 02/18/19 neg HPV neg  History of abnormal Pap or positive HPV:  no Mammogram:  10/17/23 Breast Density Cat C, BIRADS Cat 1 neg         OB History     Gravida  2   Para  2   Term  2   Preterm      AB      Living  2      SAB      IAB      Ectopic      Multiple      Live Births                 Patient Active Problem List   Diagnosis Date Noted   H/O Spinal surgery 11/13/2022   Lump on finger 12/09/2019   Right shoulder pain 07/07/2016   Breast mass, right 02/14/2016   Elbow pain, chronic 05/20/2013   Lateral epicondylitis of right elbow 05/20/2013    Past Medical History:  Diagnosis Date   Complication of anesthesia    very sensitive to aneth and pain meds   DDD (degenerative disc disease), lumbar    Elevated LDL cholesterol level    History of COVID-19 01/02/2021   Medical history non-contributory    PONV (postoperative nausea and vomiting)     Past Surgical History:  Procedure Laterality Date   BREAST BIOPSY Left 02/03/2016   U/S- Benign   BREAST BIOPSY  04/24/2007   U/S Core- Benign   BREAST CYST ASPIRATION Left    BREAST EXCISIONAL BIOPSY Left 02/23/2016   benign   BREAST SURGERY  2005   Benign Rt.breast Bx   EXCISION METACARPAL MASS Right 01/07/2020   Procedure:  Right index finger and right long finger mass excision;  Surgeon: Shari Easter, MD;  Location: [facility];  Service: Orthopedics;  Laterality: Right;  with local anesthesia   HEMILAMINOTOMY LUMBAR SPINE Left 06/03/2008   L5-S1; with microdiscectomy and medial facetectomy   MASS EXCISION Right 04/03/2013   Procedure:  MASS EXCISION AND BIOPSY;  Surgeon: Easter LELON Shari, MD;  Location: [facility];  Service: Orthopedics;  Laterality: Right;   RADIOACTIVE SEED GUIDED EXCISIONAL BREAST BIOPSY Left 02/24/2016   Procedure: RADIOACTIVE SEED GUIDED EXCISIONAL LEFT BREAST BIOPSY;  Surgeon: Donnice Bury, MD;  Location: [facility];  Service: General;  Laterality: Left;  RADIOACTIVE SEED GUIDED EXCISIONAL LEFT BREAST BIOPSY   WISDOM TOOTH EXTRACTION      Current Outpatient Medications  Medication Sig Dispense Refill   Cholecalciferol 25 MCG (1000 UT) capsule      cyanocobalamin (VITAMIN B12) 1000 MCG tablet Take 1,000 mcg by mouth daily.     estradiol  (ESTRACE ) 0.1 MG/GM vaginal cream Use  1 g vaginally two times per week as needed to maintain symptom relief. 42.5 g 2   estradiol  (VIVELLE -DOT) 0.05 MG/24HR patch Place 1 patch (0.05 mg total) onto the skin 2 (two) times a week. 24 patch 0   levonorgestrel  (MIRENA ) 20 MCG/24HR IUD 1 each by Intrauterine route once.     PARoxetine  (PAXIL ) 10 MG tablet Take 1 tablet (10 mg total) by mouth every morning. 90 tablet 3   progesterone  (PROMETRIUM ) 100 MG capsule Take 1 capsule (100 mg total) by mouth daily. Take at bedtime. 90 capsule 0   sulfamethoxazole -trimethoprim  (BACTRIM  DS) 800-160 MG tablet Take 1 tablet by mouth 2 (two) times daily. 14 tablet 0   Multiple Vitamin (MULTI-VITAMIN) tablet  (Patient not taking: Reported on 07/09/2024)     No current facility-administered medications for this visit.     ALLERGIES: Patient has no known allergies.  Family History  Problem Relation Age of Onset   GER disease  Mother        multiple strictures, treated   CAD Father 80   Hypertension Father    Hyperlipidemia Father    Alcohol abuse Father    Arthritis Father    Hyperlipidemia Brother    Stroke Paternal Grandmother    Scoliosis Daughter        treated with brace   GER disease Son     Social History   Socioeconomic History   Marital status: Married    Spouse name: Not on file   Number of children: Not on file   Years of education: Not on file   Highest education level: Master's degree (e.g., MA, MS, MEng, MEd, MSW, MBA)  Occupational History   Not on file  Tobacco Use   Smoking status: Never   Smokeless tobacco: Never  Vaping Use   Vaping status: Never Used  Substance and Sexual Activity   Alcohol use: Not Currently    Comment: 7-9/week   Drug use: No   Sexual activity: Yes    Partners: Male    Birth control/protection: I.U.D.    Comment: Mirena  IUD inserted 2018  Other Topics Concern   Not on file  Social History Narrative   Married, 1 daughter, 1 son. Both attend [employer].   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      Updated 02/2024   Social Drivers of Health   Financial Resource Strain: Low Risk  (02/27/2024)   Overall Financial Resource Strain (CARDIA)    Difficulty of Paying Living Expenses: Not very hard  Food Insecurity: No Food Insecurity (02/27/2024)   Hunger Vital Sign    Worried About Running Out of Food in the Last Year: Never true    Ran Out of Food in the Last Year: Never true  Transportation Needs: No Transportation Needs (02/27/2024)   PRAPARE - Administrator, Civil Service (Medical): No    Lack of Transportation (Non-Medical): No  Physical Activity: Insufficiently Active (02/27/2024)   Exercise Vital Sign    Days of Exercise per Week: 1 day    Minutes of Exercise per Session: 20 min  Stress: No Stress Concern Present (02/27/2024)   [employer] of Occupational Health - Occupational Stress Questionnaire    Feeling  of Stress: Only a little  Social Connections: Moderately Integrated (02/27/2024)   Social Connection and Isolation Panel    Frequency of Communication with Friends and Family: Three times a week    Frequency of Social Gatherings  with Friends and Family: Once a week    Attends Religious Services: More than 4 times per year    Active Member of [employer] or Organizations: No    Attends Banker Meetings: Never    Marital Status: Married  Catering Manager Violence: Not At Risk (02/27/2024)   Humiliation, Afraid, Rape, and Kick questionnaire    Fear of Current or Ex-Partner: No    Emotionally Abused: No    Physically Abused: No    Sexually Abused: No    Review of Systems  All other systems reviewed and are negative.   PHYSICAL EXAMINATION:   BP 108/70 (BP Location: Left Arm, Patient Position: Sitting)   Pulse 91   SpO2 98%     General appearance: alert, cooperative and appears stated age  IUD removal - procedure aborted.  Consent and time out done.  Ring forceps used to grasp strings, IUD will not pass through os.  Hibiclens prep.  Paracervical block with 10 cc 1% lidocaine , lot [id number], exp Feb. 2028.  Tenaculum to anterior cervical lip.  Os finder used to dilate cervix.  Ring forceps again used to grasp strings, IUD will not pass through os.  Procedure aborted.  No complications.  Minimal EBL.  Pelvic: External genitalia:  no lesions              Urethra:  normal appearing urethra with no masses, tenderness or lesions              Bartholins and Skenes: normal                 Vagina: normal appearing vagina with normal color and discharge, no lesions              Cervix: no lesions                Bimanual Exam:  Uterus:  normal size, contour, position, consistency, mobility, non-tender              Adnexa: no mass, fullness, tenderness     Chaperone was present for exam:  Kari HERO, CMA  ASSESSMENT:  HRT.  Doing well.  Attempted IUD removal unsuccessful.   Cervical stenosis suspected.   PLAN:  Refills of Vivelle  Dot 0.05 mg twice weekly.  #24, RF 3, Prometrium  100 mg q hs  #90, RF 3, Vaginal estradiol  cream 1 gram pv twice weekly, 42.5 grams, RF 3.   Return for pelvic US  and US  guided removal of IUD.  Rx for Cytotec 200 mcg pv night before and then morning of removal.  Plan for paracervical block.  May need more cervical dilation.    20 min  total time was spent for this patient encounter, including preparation, face-to-face counseling with the patient, coordination of care, and documentation of the encounter regarding HRT and cervical stenosis in addition to the attempted IUD removal.

## 2024-07-09 ENCOUNTER — Encounter: Payer: Self-pay | Admitting: Obstetrics and Gynecology

## 2024-07-09 ENCOUNTER — Ambulatory Visit: Admitting: Obstetrics and Gynecology

## 2024-07-09 ENCOUNTER — Other Ambulatory Visit (HOSPITAL_COMMUNITY): Payer: Self-pay

## 2024-07-09 VITALS — BP 108/70 | HR 91

## 2024-07-09 DIAGNOSIS — N882 Stricture and stenosis of cervix uteri: Secondary | ICD-10-CM | POA: Diagnosis not present

## 2024-07-09 DIAGNOSIS — Z79890 Hormone replacement therapy (postmenopausal): Secondary | ICD-10-CM

## 2024-07-09 DIAGNOSIS — Z01812 Encounter for preprocedural laboratory examination: Secondary | ICD-10-CM

## 2024-07-09 DIAGNOSIS — Z30432 Encounter for removal of intrauterine contraceptive device: Secondary | ICD-10-CM | POA: Diagnosis not present

## 2024-07-09 DIAGNOSIS — Z780 Asymptomatic menopausal state: Secondary | ICD-10-CM

## 2024-07-09 LAB — PREGNANCY, URINE: Preg Test, Ur: NEGATIVE

## 2024-07-09 MED ORDER — ESTRADIOL 0.05 MG/24HR TD PTTW
1.0000 | MEDICATED_PATCH | TRANSDERMAL | 3 refills | Status: AC
  Filled 2024-07-09 – 2024-08-12 (×2): qty 24, 84d supply, fill #0

## 2024-07-09 MED ORDER — PROGESTERONE MICRONIZED 100 MG PO CAPS
100.0000 mg | ORAL_CAPSULE | Freq: Every day | ORAL | 3 refills | Status: AC
  Filled 2024-07-09 – 2024-08-12 (×2): qty 90, 90d supply, fill #0

## 2024-07-09 MED ORDER — ESTRADIOL 0.01 % VA CREA
TOPICAL_CREAM | VAGINAL | 2 refills | Status: AC
  Filled 2024-07-09: qty 42.5, 150d supply, fill #0
  Filled 2024-08-12: qty 42.5, 90d supply, fill #0

## 2024-07-09 MED ORDER — MISOPROSTOL 200 MCG PO TABS
ORAL_TABLET | ORAL | 0 refills | Status: AC
  Filled 2024-07-09: qty 2, 2d supply, fill #0
  Filled 2024-08-12: qty 2, 1d supply, fill #0

## 2024-07-10 ENCOUNTER — Other Ambulatory Visit (HOSPITAL_COMMUNITY): Payer: Self-pay

## 2024-07-19 ENCOUNTER — Other Ambulatory Visit (HOSPITAL_COMMUNITY): Payer: Self-pay

## 2024-07-24 ENCOUNTER — Other Ambulatory Visit (HOSPITAL_COMMUNITY): Payer: Self-pay

## 2024-08-12 ENCOUNTER — Other Ambulatory Visit (HOSPITAL_COMMUNITY): Payer: Self-pay

## 2024-08-12 ENCOUNTER — Other Ambulatory Visit: Payer: Self-pay

## 2024-08-19 ENCOUNTER — Other Ambulatory Visit: Payer: Self-pay | Admitting: Obstetrics and Gynecology

## 2024-08-19 DIAGNOSIS — Z30432 Encounter for removal of intrauterine contraceptive device: Secondary | ICD-10-CM

## 2024-08-19 DIAGNOSIS — Z780 Asymptomatic menopausal state: Secondary | ICD-10-CM

## 2024-08-19 DIAGNOSIS — N882 Stricture and stenosis of cervix uteri: Secondary | ICD-10-CM

## 2024-08-19 DIAGNOSIS — Z79890 Hormone replacement therapy (postmenopausal): Secondary | ICD-10-CM

## 2024-08-19 DIAGNOSIS — Z01812 Encounter for preprocedural laboratory examination: Secondary | ICD-10-CM

## 2024-08-20 NOTE — Progress Notes (Unsigned)
 GYNECOLOGY  VISIT   HPI: 53 y.o.   Married  Caucasian female   G2P2002 with No LMP recorded. (Menstrual status: IUD).   here for: U/S Guided IUD Removal.  Took Cytotec  vaginally in preparation for the IUD removal.   Mirena  IUD placed 2018.    Attempted IUD removal on 07/09/24 was not successful.   Patient is on HRT.  UPT negative.   GYNECOLOGIC HISTORY: No LMP recorded. (Menstrual status: IUD). Contraception:   Mirena  IUD inserted 2018  Menopausal hormone therapy:  estrace  and vivelle   Last 2 paps:  05/12/24 neg, HR HPV neg, 02/18/19 neg HPV neg  History of abnormal Pap or positive HPV:  no Mammogram:  10/17/23 Breast Density Cat C, BIRADS Cat 1 neg         OB History     Gravida  2   Para  2   Term  2   Preterm      AB      Living  2      SAB      IAB      Ectopic      Multiple      Live Births                 Patient Active Problem List   Diagnosis Date Noted   H/O Spinal surgery 11/13/2022   Lump on finger 12/09/2019   Right shoulder pain 07/07/2016   Breast mass, right 02/14/2016   Elbow pain, chronic 05/20/2013   Lateral epicondylitis of right elbow 05/20/2013    Past Medical History:  Diagnosis Date   Complication of anesthesia    very sensitive to aneth and pain meds   DDD (degenerative disc disease), lumbar    Elevated LDL cholesterol level    History of COVID-19 01/02/2021   Medical history non-contributory    PONV (postoperative nausea and vomiting)     Past Surgical History:  Procedure Laterality Date   BREAST BIOPSY Left 02/03/2016   U/S- Benign   BREAST BIOPSY  04/24/2007   U/S Core- Benign   BREAST CYST ASPIRATION Left    BREAST EXCISIONAL BIOPSY Left 02/23/2016   benign   BREAST SURGERY  2005   Benign Rt.breast Bx   EXCISION METACARPAL MASS Right 01/07/2020   Procedure: Right index finger and right long finger mass excision;  Surgeon: Shari Easter, MD;  Location: [facility];  Service: Orthopedics;   Laterality: Right;  with local anesthesia   HEMILAMINOTOMY LUMBAR SPINE Left 06/03/2008   L5-S1; with microdiscectomy and medial facetectomy   MASS EXCISION Right 04/03/2013   Procedure:  MASS EXCISION AND BIOPSY;  Surgeon: Easter LELON Shari, MD;  Location: [facility];  Service: Orthopedics;  Laterality: Right;   RADIOACTIVE SEED GUIDED EXCISIONAL BREAST BIOPSY Left 02/24/2016   Procedure: RADIOACTIVE SEED GUIDED EXCISIONAL LEFT BREAST BIOPSY;  Surgeon: Donnice Bury, MD;  Location: [facility];  Service: General;  Laterality: Left;  RADIOACTIVE SEED GUIDED EXCISIONAL LEFT BREAST BIOPSY   WISDOM TOOTH EXTRACTION      Current Outpatient Medications  Medication Sig Dispense Refill   Cholecalciferol 25 MCG (1000 UT) capsule      cyanocobalamin (VITAMIN B12) 1000 MCG tablet Take 1,000 mcg by mouth daily.     estradiol  (ESTRACE ) 0.01 % CREA vaginal cream Place 1 gram vaginally two times per week as needed to maintain symptom relief. 42.5 g 2   estradiol  (ESTRACE ) 0.1 MG/GM vaginal cream Use 1 g  vaginally two times per week as needed to maintain symptom relief. 42.5 g 2   estradiol  (VIVELLE -DOT) 0.05 MG/24HR patch Place 1 patch (0.05 mg total) onto the skin 2 (two) times a week. 24 patch 3   levonorgestrel  (MIRENA ) 20 MCG/24HR IUD 1 each by Intrauterine route once.     misoprostol  (CYTOTEC ) 200 MCG tablet Place one tablet (200 mcg) in the vagina the night before the IUD removal and then place one tablet in the vagina the morning of the IUD removal. 2 tablet 0   Multiple Vitamin (MULTI-VITAMIN) tablet  (Patient not taking: Reported on 07/09/2024)     PARoxetine  (PAXIL ) 10 MG tablet Take 1 tablet (10 mg total) by mouth every morning. 90 tablet 3   progesterone  (PROMETRIUM ) 100 MG capsule Take 1 capsule (100 mg total) by mouth at bedtime. Take at bedtime. 90 capsule 3   sulfamethoxazole -trimethoprim  (BACTRIM  DS) 800-160 MG tablet Take 1 tablet by mouth 2 (two) times daily. 14  tablet 0   No current facility-administered medications for this visit.     ALLERGIES: Patient has no known allergies.  Family History  Problem Relation Age of Onset   GER disease Mother        multiple strictures, treated   CAD Father 33   Hypertension Father    Hyperlipidemia Father    Alcohol abuse Father    Arthritis Father    Hyperlipidemia Brother    Stroke Paternal Grandmother    Scoliosis Daughter        treated with brace   GER disease Son     Social History   Socioeconomic History   Marital status: Married    Spouse name: Not on file   Number of children: Not on file   Years of education: Not on file   Highest education level: Master's degree (e.g., MA, MS, MEng, MEd, MSW, MBA)  Occupational History   Not on file  Tobacco Use   Smoking status: Never   Smokeless tobacco: Never  Vaping Use   Vaping status: Never Used  Substance and Sexual Activity   Alcohol use: Not Currently    Comment: 7-9/week   Drug use: No   Sexual activity: Yes    Partners: Male    Birth control/protection: I.U.D.    Comment: Mirena  IUD inserted 2018  Other Topics Concern   Not on file  Social History Narrative   Married, 1 daughter, 1 son. Both attend [employer].   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      Updated 02/2024   Social Drivers of Health   Tobacco Use: Low Risk (08/21/2024)   Patient History    Smoking Tobacco Use: Never    Smokeless Tobacco Use: Never    Passive Exposure: Not on file  Financial Resource Strain: Low Risk (02/27/2024)   Overall Financial Resource Strain (CARDIA)    Difficulty of Paying Living Expenses: Not very hard  Food Insecurity: No Food Insecurity (02/27/2024)   Epic    Worried About Radiation Protection Practitioner of Food in the Last Year: Never true    Ran Out of Food in the Last Year: Never true  Transportation Needs: No Transportation Needs (02/27/2024)   Epic    Lack of Transportation (Medical): No    Lack of Transportation  (Non-Medical): No  Physical Activity: Insufficiently Active (02/27/2024)   Exercise Vital Sign    Days of Exercise per Week: 1 day    Minutes of Exercise per  Session: 20 min  Stress: No Stress Concern Present (02/27/2024)   [employer] of Occupational Health - Occupational Stress Questionnaire    Feeling of Stress: Only a little  Social Connections: Moderately Integrated (02/27/2024)   Social Connection and Isolation Panel    Frequency of Communication with Friends and Family: Three times a week    Frequency of Social Gatherings with Friends and Family: Once a week    Attends Religious Services: More than 4 times per year    Active Member of Clubs or Organizations: No    Attends Banker Meetings: Never    Marital Status: Married  Catering Manager Violence: Not At Risk (02/27/2024)   Epic    Fear of Current or Ex-Partner: No    Emotionally Abused: No    Physically Abused: No    Sexually Abused: No  Depression (PHQ2-9): Low Risk (05/12/2024)   Depression (PHQ2-9)    PHQ-2 Score: 0  Alcohol Screen: Low Risk (10/22/2023)   Alcohol Screen    Last Alcohol Screening Score (AUDIT): 7  Housing: Unknown (02/27/2024)   Epic    Unable to Pay for Housing in the Last Year: Not on file    Number of Times Moved in the Last Year: 0    Homeless in the Last Year: No  Utilities: Not At Risk (02/27/2024)   Epic    Threatened with loss of utilities: No  Health Literacy: Not on file    Review of Systems  All other systems reviewed and are negative.   PHYSICAL EXAMINATION:   BP 114/76 (BP Location: Right Arm, Patient Position: Sitting)   Pulse 78   SpO2 98%     General appearance: alert, cooperative and appears stated age   Pelvic US : Uterus 6.78 x 4.33 x 3.29 cm.  No myometrial masses.  EMS 2.8 mm.  IUD noted centrally in the endometrial canal.  Left ovary 1.93 x 1.16 x 1.48 cm.  Right ovary 1.77 x 1.66 x 1.42 cm.  No adnexal masses.  No free fluid.   IUD  removal: Consent and time out done.  Cytotec  tablets removed.  Ring forceps used to attempt IUD removal, not successful.  Hibiclens prep. Paracervical block 10 cc 1% lidocaine , lot [id number], December 02, 2025. Tenaculum to anterior cervical lip.  Os finder used.  Cervix dilated.  IUD removed with ring forceps, intact, shown to patient and discarded.  No complications.  Minimal EBL.               Chaperone was present for exam:  Kari HERO, CMA  ASSESSMENT:  IUD removal, successful.   PLAN:  Expect potential bleeding.   Continue HRT.  FU prn.

## 2024-08-21 ENCOUNTER — Encounter: Payer: Self-pay | Admitting: Obstetrics and Gynecology

## 2024-08-21 ENCOUNTER — Ambulatory Visit: Admitting: Obstetrics and Gynecology

## 2024-08-21 ENCOUNTER — Ambulatory Visit

## 2024-08-21 ENCOUNTER — Other Ambulatory Visit: Payer: Self-pay | Admitting: Obstetrics and Gynecology

## 2024-08-21 VITALS — BP 114/76 | HR 78

## 2024-08-21 DIAGNOSIS — Z780 Asymptomatic menopausal state: Secondary | ICD-10-CM

## 2024-08-21 DIAGNOSIS — N882 Stricture and stenosis of cervix uteri: Secondary | ICD-10-CM

## 2024-08-21 DIAGNOSIS — Z01812 Encounter for preprocedural laboratory examination: Secondary | ICD-10-CM

## 2024-08-21 DIAGNOSIS — Z79890 Hormone replacement therapy (postmenopausal): Secondary | ICD-10-CM

## 2024-08-21 DIAGNOSIS — Z30432 Encounter for removal of intrauterine contraceptive device: Secondary | ICD-10-CM

## 2024-08-21 LAB — PREGNANCY, URINE: Preg Test, Ur: NEGATIVE

## 2025-03-19 ENCOUNTER — Encounter: Payer: Self-pay | Admitting: Family Medicine
